# Patient Record
Sex: Female | Born: 2000 | Race: Black or African American | Hispanic: No | Marital: Single | Smoking: Current every day smoker
Health system: Southern US, Community
[De-identification: ages and names within clinical notes are randomized; demographics above are authoritative.]

## PROBLEM LIST (undated history)

## (undated) ENCOUNTER — Ambulatory Visit: Admission: EM | Payer: Medicaid Other | Source: Home / Self Care

## (undated) ENCOUNTER — Emergency Department: Admission: EM | Payer: MEDICAID | Source: Home / Self Care

## (undated) DIAGNOSIS — J45909 Unspecified asthma, uncomplicated: Secondary | ICD-10-CM

## (undated) DIAGNOSIS — A6000 Herpesviral infection of urogenital system, unspecified: Secondary | ICD-10-CM

## (undated) DIAGNOSIS — E669 Obesity, unspecified: Secondary | ICD-10-CM

---
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
## 2018-01-24 ENCOUNTER — Encounter (HOSPITAL_COMMUNITY): Payer: Self-pay | Admitting: Emergency Medicine

## 2018-01-24 ENCOUNTER — Other Ambulatory Visit: Payer: Self-pay

## 2018-01-24 ENCOUNTER — Emergency Department (HOSPITAL_COMMUNITY)
Admission: EM | Admit: 2018-01-24 | Discharge: 2018-01-24 | Disposition: A | Discharge status: Home / Self Care | Payer: Commercial Managed Care - PPO | Attending: Emergency Medicine | Admitting: Emergency Medicine

## 2018-01-24 DIAGNOSIS — R519 Headache, unspecified: Secondary | ICD-10-CM

## 2018-01-24 DIAGNOSIS — J45909 Unspecified asthma, uncomplicated: Secondary | ICD-10-CM | POA: Diagnosis not present

## 2018-01-24 DIAGNOSIS — R51 Headache: Secondary | ICD-10-CM | POA: Diagnosis present

## 2018-01-24 DIAGNOSIS — G43909 Migraine, unspecified, not intractable, without status migrainosus: Secondary | ICD-10-CM | POA: Diagnosis not present

## 2018-01-24 MED ORDER — SODIUM CHLORIDE 0.9 % IV BOLUS
1000.0000 mL | Freq: Once | INTRAVENOUS | Status: AC
  Administered 2018-01-24: 1000 mL via INTRAVENOUS

## 2018-01-24 MED ORDER — DIPHENHYDRAMINE HCL 50 MG/ML IJ SOLN
12.5000 mg | Freq: Once | INTRAMUSCULAR | Status: AC
  Administered 2018-01-24: 12.5 mg via INTRAVENOUS
  Filled 2018-01-24: qty 1

## 2018-01-24 MED ORDER — METOCLOPRAMIDE HCL 5 MG/ML IJ SOLN
10.0000 mg | Freq: Once | INTRAMUSCULAR | Status: AC
  Administered 2018-01-24: 10 mg via INTRAVENOUS
  Filled 2018-01-24: qty 2

## 2018-01-24 MED ORDER — KETOROLAC TROMETHAMINE 30 MG/ML IJ SOLN
30.0000 mg | Freq: Once | INTRAMUSCULAR | Status: AC
  Administered 2018-01-24: 30 mg via INTRAVENOUS
  Filled 2018-01-24: qty 1

## 2018-01-24 NOTE — ED Notes (Signed)
Says she obtained Hep A and B shots on Tuesday and headache started on Thursday with neck and back pain.  Denies injury.  Has taken OTC migraine meds with mild relief.  Did not take any meds today.  Pt is not sensitive to light, sound nor room temp.  Pt says pain is in her temporal area and goes to her neck and down her back.  Pain is constant and rates pain 10/10.

## 2018-01-24 NOTE — Discharge Instructions (Signed)
See your Physician for recheck in 1 week.   Try Excedrin migraine for future headaches.

## 2018-01-24 NOTE — ED Triage Notes (Signed)
Pt c/o of migraine with neck pain x 1 week. No medications taken at home.

## 2018-01-25 ENCOUNTER — Emergency Department (HOSPITAL_COMMUNITY)
Admission: EM | Admit: 2018-01-25 | Discharge: 2018-01-25 | Disposition: A | Discharge status: Home / Self Care | Payer: Commercial Managed Care - PPO | Attending: Emergency Medicine | Admitting: Emergency Medicine

## 2018-01-25 ENCOUNTER — Other Ambulatory Visit: Payer: Self-pay

## 2018-01-25 ENCOUNTER — Encounter (HOSPITAL_COMMUNITY): Payer: Self-pay | Admitting: *Deleted

## 2018-01-25 DIAGNOSIS — R519 Headache, unspecified: Secondary | ICD-10-CM

## 2018-01-25 DIAGNOSIS — J45909 Unspecified asthma, uncomplicated: Secondary | ICD-10-CM | POA: Diagnosis not present

## 2018-01-25 DIAGNOSIS — R110 Nausea: Secondary | ICD-10-CM | POA: Insufficient documentation

## 2018-01-25 DIAGNOSIS — R51 Headache: Secondary | ICD-10-CM | POA: Insufficient documentation

## 2018-01-25 MED ORDER — DIPHENHYDRAMINE HCL 50 MG/ML IJ SOLN
25.0000 mg | Freq: Once | INTRAMUSCULAR | Status: AC
Start: 2018-01-25 — End: 2018-01-25
  Administered 2018-01-25: 25 mg via INTRAVENOUS
  Filled 2018-01-25: qty 1

## 2018-01-25 MED ORDER — BUTALBITAL-APAP-CAFFEINE 50-325-40 MG PO TABS: 1.0000 | ORAL_TABLET | Freq: Four times a day (QID) | ORAL | 0 refills | Status: DC | PRN

## 2018-01-25 MED ORDER — DEXAMETHASONE 4 MG PO TABS: 4.0000 mg | ORAL_TABLET | Freq: Two times a day (BID) | ORAL | 0 refills | Status: DC

## 2018-01-25 MED ORDER — KETOROLAC TROMETHAMINE 30 MG/ML IJ SOLN
30.0000 mg | Freq: Once | INTRAMUSCULAR | Status: AC
Start: 2018-01-25 — End: 2018-01-25
  Administered 2018-01-25: 30 mg via INTRAVENOUS
  Filled 2018-01-25: qty 1

## 2018-01-25 MED ORDER — PROCHLORPERAZINE EDISYLATE 10 MG/2ML IJ SOLN
5.0000 mg | Freq: Once | INTRAMUSCULAR | Status: AC
  Administered 2018-01-25: 5 mg via INTRAVENOUS
  Filled 2018-01-25: qty 2

## 2018-01-25 NOTE — ED Provider Notes (Signed)
[facility] EMERGENCY DEPARTMENT Provider Note   CSN: [id number] Arrival date & time: 01/25/18  1648     History   Chief Complaint Chief Complaint  Patient presents with  . Headache    HPI Kristin Cisneros is a 17 y.o. female.  Patient is an 17 year old female who presents to the emergency department with a complaint of headaches.  The patient states that she was seen here on yesterday.  She had a very similar headache.  She was treated with IV medications.  The headache got better, but as the medications left her system, she says the headache came back.  Today she again has pain.  She describes this as a throbbing pain and sharp pain in both temples, the top of her head, and going down the back of the neck.  She has not had any injury to her head or fallen.  This is not the worst headache ever in her life.  She has not had vision changes, she has had some mild nausea, but no actual vomiting in the last 24 hours.  She presents now for assistance with this headache.  The history is provided by the patient.  Headache   Associated symptoms include nausea. Pertinent negatives include no palpitations and no shortness of breath.    Past Medical History:  Diagnosis Date  . Asthma   . Obesity     There are no active problems to display for this patient.   History reviewed. No pertinent surgical history.   OB History    Gravida      Para      Term      Preterm      AB      Living  1     SAB      TAB      Ectopic      Multiple      Live Births               Home Medications    Prior to Admission medications   Medication Sig Start Date End Date Taking? Authorizing Provider  benzonatate (TESSALON) 100 MG capsule Take 2 capsules (200 mg total) by mouth 3 (three) times daily as needed. 11/11/17   Burgess Amor, PA-C  cyclobenzaprine (FLEXERIL) 10 MG tablet Take 1 tablet (10 mg total) by mouth every 6 (six) hours as needed for muscle spasms. 08/11/17   Ivery Quale, PA-C  meloxicam (MOBIC) 15 MG tablet Take 1 tablet (15 mg total) by mouth daily. 08/03/17   Cuthriell, Delorise Royals, PA-C  methocarbamol (ROBAXIN) 500 MG tablet Take 1 tablet (500 mg total) by mouth 4 (four) times daily. 08/03/17   Cuthriell, Delorise Royals, PA-C    Family History Family History  Problem Relation Age of Onset  . Hypertension Mother     Social History Social History   Tobacco Use  . Smoking status: Never Smoker  . Smokeless tobacco: Never Used  Substance Use Topics  . Alcohol use: No  . Drug use: No     Allergies   Patient has no known allergies.   Review of Systems Review of Systems  Constitutional: Negative for activity change.       All ROS Neg except as noted in HPI  HENT: Negative for nosebleeds.   Eyes: Positive for photophobia. Negative for discharge.  Respiratory: Negative for cough, shortness of breath and wheezing.   Cardiovascular: Negative for chest pain and palpitations.  Gastrointestinal: Positive for nausea. Negative for abdominal  pain and blood in stool.  Genitourinary: Negative for dysuria, frequency and hematuria.  Musculoskeletal: Negative for arthralgias, back pain and neck pain.  Skin: Negative.   Neurological: Positive for headaches. Negative for dizziness, seizures and speech difficulty.  Psychiatric/Behavioral: Negative for confusion and hallucinations.     Physical Exam Updated Vital Signs BP (!) 98/54 (BP Location: Left Arm)   Pulse 93   Temp 99 F (37.2 C) (Oral)   Resp 17   Ht 5\' 2"  (1.575 m)   Wt 87.5 kg   LMP 01/16/2018   SpO2 98%   BMI 35.30 kg/m   Physical Exam  Constitutional: She appears well-developed and well-nourished. No distress.  HENT:  Head: Normocephalic and atraumatic.  Right Ear: External ear normal.  Left Ear: External ear normal.  Eyes: Conjunctivae are normal. Right eye exhibits no discharge. Left eye exhibits no discharge. No scleral icterus.  Neck: Neck supple. No tracheal deviation  present.  Cardiovascular: Normal rate, regular rhythm and intact distal pulses.  Pulmonary/Chest: Effort normal and breath sounds normal. No stridor. No respiratory distress. She has no wheezes. She has no rales.  Abdominal: Soft. Bowel sounds are normal. She exhibits no distension. There is no tenderness. There is no rebound and no guarding.  Musculoskeletal: She exhibits no edema or tenderness.  Neurological: She is alert. She has normal strength. No cranial nerve deficit (no facial droop, extraocular movements intact, no slurred speech) or sensory deficit. She exhibits normal muscle tone. She displays no seizure activity. Coordination normal.  Skin: Skin is warm and dry. No rash noted.  Psychiatric: She has a normal mood and affect.  Nursing note and vitals reviewed.    ED Treatments / Results  Labs (all labs ordered are listed, but only abnormal results are displayed) Labs Reviewed - No data to display  EKG None  Radiology No results found.  Procedures Procedures (including critical care time)  Medications Ordered in ED Medications  ketorolac (TORADOL) 30 MG/ML injection 30 mg (30 mg Intravenous Given 01/25/18 1839)  prochlorperazine (COMPAZINE) injection 5 mg (5 mg Intravenous Given 01/25/18 1839)  diphenhydrAMINE (BENADRYL) injection 25 mg (25 mg Intravenous Given 01/25/18 1838)     Initial Impression / Assessment and Plan / ED Course  I have reviewed the triage vital signs and the nursing notes.  Pertinent labs & imaging results that were available during my care of the patient were reviewed by me and considered in my medical decision making (see chart for details).       Final Clinical Impressions(s) / ED Diagnoses MDM  Vital signs reviewed.  No gross neurologic deficits appreciated on examination.  Patient's previous record reviewed.  Patient treated with IV Toradol, Compazine, and Benadryl.  Recheck.  Patient states headache is feeling much better.  No further  nausea.  Prescription for Decadron and Fioricet given to the patient.  Patient referred to Dr. Gaynell Face with the [facility] in [place].  Patient is in agreement with this plan.   Final diagnoses:  Bad headache    ED Discharge Orders         Ordered    dexamethasone (DECADRON) 4 MG tablet  2 times daily with meals     01/25/18 2004    butalbital-acetaminophen-caffeine (FIORICET, ESGIC) 50-325-40 MG tablet  Every 6 hours PRN     01/25/18 2004           Ivery Quale, PA-C 01/25/18 2029    Loren Racer, MD 01/28/18 1539

## 2018-01-25 NOTE — ED Triage Notes (Signed)
Patient seen yesterday for headache, treated, patient felt relief, headache returned last night with continued headache pain today.

## 2018-01-25 NOTE — ED Provider Notes (Signed)
[facility] EMERGENCY DEPARTMENT Provider Note   CSN: [id number] Arrival date & time: 01/24/18  1105     History   Chief Complaint Chief Complaint  Patient presents with  . Migraine    HPI Kristin Cisneros is a 17 y.o. female.  The history is provided by the patient. No language interpreter was used.  Migraine  This is a new problem. The current episode started more than 1 week ago. The problem occurs constantly. The problem has been gradually worsening. Nothing aggravates the symptoms. Nothing relieves the symptoms. She has tried nothing for the symptoms. The treatment provided no relief.  Pt complains of a bad headache.  Pt reports she has had for a week.  No relief with ibuprofen or tylenol   Past Medical History:  Diagnosis Date  . Asthma   . Obesity     There are no active problems to display for this patient.   History reviewed. No pertinent surgical history.   OB History    Gravida      Para      Term      Preterm      AB      Living  1     SAB      TAB      Ectopic      Multiple      Live Births               Home Medications    Prior to Admission medications   Medication Sig Start Date End Date Taking? Authorizing Provider  benzonatate (TESSALON) 100 MG capsule Take 2 capsules (200 mg total) by mouth 3 (three) times daily as needed. 11/11/17   Burgess Amor, PA-C  cyclobenzaprine (FLEXERIL) 10 MG tablet Take 1 tablet (10 mg total) by mouth every 6 (six) hours as needed for muscle spasms. 08/11/17   Ivery Quale, PA-C  meloxicam (MOBIC) 15 MG tablet Take 1 tablet (15 mg total) by mouth daily. 08/03/17   Cuthriell, Delorise Royals, PA-C  methocarbamol (ROBAXIN) 500 MG tablet Take 1 tablet (500 mg total) by mouth 4 (four) times daily. 08/03/17   Cuthriell, Delorise Royals, PA-C    Family History Family History  Problem Relation Age of Onset  . Hypertension Mother     Social History Social History   Tobacco Use  . Smoking status: Never Smoker  .  Smokeless tobacco: Never Used  Substance Use Topics  . Alcohol use: No  . Drug use: No     Allergies   Patient has no known allergies.   Review of Systems Review of Systems   Physical Exam Updated Vital Signs BP 114/65 (BP Location: Left Arm)   Pulse 72   Temp 98.3 F (36.8 C) (Tympanic)   Resp 16   Ht 5\' 2"  (1.575 m)   Wt 87.5 kg   SpO2 99%   BMI 35.30 kg/m   Physical Exam  Constitutional: She is oriented to person, place, and time. She appears well-developed and well-nourished.  HENT:  Head: Normocephalic.  Right Ear: External ear normal.  Left Ear: External ear normal.  Mouth/Throat: Oropharynx is clear and moist.  Eyes: Pupils are equal, round, and reactive to light. EOM are normal.  Neck: Normal range of motion.  Cardiovascular: Normal rate and regular rhythm.  Pulmonary/Chest: Effort normal.  Abdominal: She exhibits no distension.  Musculoskeletal: Normal range of motion.  Neurological: She is alert and oriented to person, place, and time.  Skin: Skin is warm.  Psychiatric: She has a normal mood and affect.  Nursing note and vitals reviewed.    ED Treatments / Results  Labs (all labs ordered are listed, but only abnormal results are displayed) Labs Reviewed - No data to display  EKG None  Radiology No results found.  Procedures Procedures (including critical care time)  Medications Ordered in ED Medications  sodium chloride 0.9 % bolus 1,000 mL (0 mLs Intravenous Stopped 01/24/18 1449)  ketorolac (TORADOL) 30 MG/ML injection 30 mg (30 mg Intravenous Given 01/24/18 1312)  diphenhydrAMINE (BENADRYL) injection 12.5 mg (12.5 mg Intravenous Given 01/24/18 1313)  metoCLOPramide (REGLAN) injection 10 mg (10 mg Intravenous Given 01/24/18 1311)     Initial Impression / Assessment and Plan / ED Course  I have reviewed the triage vital signs and the nursing notes.  Pertinent labs & imaging results that were available during my care of the patient were  reviewed by me and considered in my medical decision making (see chart for details).     Pt reports her headache feels much better. Pt advised to try excedrin for headaches   Final Clinical Impressions(s) / ED Diagnoses   Final diagnoses:  Bad headache    ED Discharge Orders    None    An After Visit Summary was printed and given to the patient.    Elson Areas, [place] 01/25/18 1631    Marily Memos, MD 01/26/18 2154

## 2018-01-25 NOTE — Discharge Instructions (Addendum)
Your vital signs are within normal limits.  Your neurologic examination is normal.  Your headache has responded nicely to the medication.  Please see Dr. Neale Burly, or a member of his staff for evaluation of your recurrent headaches.  Please use Decadron 2 times daily with food.  Use Fioricet every 6 hours as needed for headache.  This medication may cause drowsiness, please use it with caution.

## 2018-03-16 ENCOUNTER — Emergency Department (HOSPITAL_COMMUNITY): Payer: Commercial Managed Care - PPO

## 2018-03-16 ENCOUNTER — Encounter (HOSPITAL_COMMUNITY): Payer: Self-pay | Admitting: Emergency Medicine

## 2018-03-16 ENCOUNTER — Emergency Department (HOSPITAL_COMMUNITY)
Admission: EM | Admit: 2018-03-16 | Discharge: 2018-03-16 | Disposition: A | Discharge status: Home / Self Care | Payer: Commercial Managed Care - PPO | Attending: Emergency Medicine | Admitting: Emergency Medicine

## 2018-03-16 ENCOUNTER — Other Ambulatory Visit: Payer: Self-pay

## 2018-03-16 DIAGNOSIS — J45909 Unspecified asthma, uncomplicated: Secondary | ICD-10-CM | POA: Diagnosis not present

## 2018-03-16 DIAGNOSIS — S39012A Strain of muscle, fascia and tendon of lower back, initial encounter: Secondary | ICD-10-CM

## 2018-03-16 DIAGNOSIS — Y939 Activity, unspecified: Secondary | ICD-10-CM | POA: Diagnosis not present

## 2018-03-16 DIAGNOSIS — S20219A Contusion of unspecified front wall of thorax, initial encounter: Secondary | ICD-10-CM | POA: Diagnosis not present

## 2018-03-16 DIAGNOSIS — S161XXA Strain of muscle, fascia and tendon at neck level, initial encounter: Secondary | ICD-10-CM | POA: Insufficient documentation

## 2018-03-16 DIAGNOSIS — Y92410 Unspecified street and highway as the place of occurrence of the external cause: Secondary | ICD-10-CM | POA: Insufficient documentation

## 2018-03-16 DIAGNOSIS — Y998 Other external cause status: Secondary | ICD-10-CM | POA: Insufficient documentation

## 2018-03-16 DIAGNOSIS — Z79899 Other long term (current) drug therapy: Secondary | ICD-10-CM | POA: Insufficient documentation

## 2018-03-16 DIAGNOSIS — S199XXA Unspecified injury of neck, initial encounter: Secondary | ICD-10-CM | POA: Diagnosis present

## 2018-03-16 LAB — POC URINE PREG, ED: Preg Test, Ur: NEGATIVE

## 2018-03-16 MED ORDER — IBUPROFEN 600 MG PO TABS: 600.0000 mg | ORAL_TABLET | Freq: Four times a day (QID) | ORAL | 0 refills | Status: DC

## 2018-03-16 MED ORDER — CYCLOBENZAPRINE HCL 10 MG PO TABS: 10.0000 mg | ORAL_TABLET | Freq: Three times a day (TID) | ORAL | 0 refills | Status: DC

## 2018-03-16 MED ORDER — IBUPROFEN 800 MG PO TABS
800.0000 mg | ORAL_TABLET | Freq: Once | ORAL | Status: AC
  Administered 2018-03-16: 800 mg via ORAL
  Filled 2018-03-16: qty 1

## 2018-03-16 MED ORDER — ONDANSETRON HCL 4 MG PO TABS
4.0000 mg | ORAL_TABLET | Freq: Once | ORAL | Status: AC
  Administered 2018-03-16: 4 mg via ORAL
  Filled 2018-03-16: qty 1

## 2018-03-16 MED ORDER — TRAMADOL HCL 50 MG PO TABS
100.0000 mg | ORAL_TABLET | Freq: Once | ORAL | Status: AC
  Administered 2018-03-16: 100 mg via ORAL
  Filled 2018-03-16: qty 2

## 2018-03-16 MED ORDER — CYCLOBENZAPRINE HCL 10 MG PO TABS
10.0000 mg | ORAL_TABLET | Freq: Once | ORAL | Status: AC
  Administered 2018-03-16: 10 mg via ORAL
  Filled 2018-03-16: qty 1
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
## 2018-03-16 NOTE — ED Notes (Signed)
Duplicate order on POC preg, patient tested once.

## 2018-03-16 NOTE — Discharge Instructions (Signed)
Your vital signs within normal limits.  Your oxygen level is 99% on room air.  Your x-rays are negative for fracture or dislocation.  Your examination is consistent with muscle strain, and contusion of the chest wall.  Please use 600 mg of ibuprofen and 1000 mg of Tylenol with breakfast, lunch, dinner, and at bedtime.  Use Flexeril 3 times daily when possible for spasm pain. This medication may cause drowsiness. Please do not drink, drive, or participate in activity that requires concentration while taking this medication.  See your primary physician or return to the emergency department if any changes in your condition, problems, or concerns.
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
## 2018-11-25 ENCOUNTER — Emergency Department (HOSPITAL_COMMUNITY): Payer: Self-pay

## 2018-11-25 ENCOUNTER — Encounter (HOSPITAL_COMMUNITY): Payer: Self-pay | Admitting: Emergency Medicine

## 2018-11-25 ENCOUNTER — Other Ambulatory Visit: Payer: Self-pay

## 2018-11-25 ENCOUNTER — Emergency Department (HOSPITAL_COMMUNITY)
Admission: EM | Admit: 2018-11-25 | Discharge: 2018-11-25 | Disposition: A | Discharge status: Home / Self Care | Payer: Self-pay | Attending: Emergency Medicine | Admitting: Emergency Medicine

## 2018-11-25 DIAGNOSIS — R079 Chest pain, unspecified: Secondary | ICD-10-CM | POA: Insufficient documentation

## 2018-11-25 DIAGNOSIS — T148XXA Other injury of unspecified body region, initial encounter: Secondary | ICD-10-CM | POA: Insufficient documentation

## 2018-11-25 DIAGNOSIS — Y92410 Unspecified street and highway as the place of occurrence of the external cause: Secondary | ICD-10-CM | POA: Insufficient documentation

## 2018-11-25 DIAGNOSIS — R51 Headache: Secondary | ICD-10-CM | POA: Insufficient documentation

## 2018-11-25 DIAGNOSIS — Y9389 Activity, other specified: Secondary | ICD-10-CM | POA: Insufficient documentation

## 2018-11-25 DIAGNOSIS — Z79899 Other long term (current) drug therapy: Secondary | ICD-10-CM | POA: Insufficient documentation

## 2018-11-25 DIAGNOSIS — Y999 Unspecified external cause status: Secondary | ICD-10-CM | POA: Insufficient documentation

## 2018-11-25 LAB — CBC WITH DIFFERENTIAL/PLATELET
Abs Immature Granulocytes: 0.02 10*3/uL (ref 0.00–0.07)
Basophils Absolute: 0 10*3/uL (ref 0.0–0.1)
Basophils Relative: 1 %
Eosinophils Absolute: 0.1 10*3/uL (ref 0.0–0.5)
Eosinophils Relative: 2 %
HCT: 37.6 % (ref 36.0–46.0)
Hemoglobin: 12.6 g/dL (ref 12.0–15.0)
Immature Granulocytes: 0 %
Lymphocytes Relative: 36 %
Lymphs Abs: 2 10*3/uL (ref 0.7–4.0)
MCH: 27.3 pg (ref 26.0–34.0)
MCHC: 33.5 g/dL (ref 30.0–36.0)
MCV: 81.4 fL (ref 80.0–100.0)
Monocytes Absolute: 0.4 10*3/uL (ref 0.1–1.0)
Monocytes Relative: 7 %
Neutro Abs: 3 10*3/uL (ref 1.7–7.7)
Neutrophils Relative %: 54 %
Platelets: 160 10*3/uL (ref 150–400)
RBC: 4.62 MIL/uL (ref 3.87–5.11)
RDW: 14.1 % (ref 11.5–15.5)
WBC: 5.4 10*3/uL (ref 4.0–10.5)
nRBC: 0 % (ref 0.0–0.2)

## 2018-11-25 LAB — COMPREHENSIVE METABOLIC PANEL
ALT: 12 U/L (ref 0–44)
AST: 20 U/L (ref 15–41)
Albumin: 3.8 g/dL (ref 3.5–5.0)
Alkaline Phosphatase: 93 U/L (ref 38–126)
Anion gap: 10 (ref 5–15)
BUN: 16 mg/dL (ref 6–20)
CO2: 21 mmol/L — ABNORMAL LOW (ref 22–32)
Calcium: 9 mg/dL (ref 8.9–10.3)
Chloride: 105 mmol/L (ref 98–111)
Creatinine, Ser: 0.73 mg/dL (ref 0.44–1.00)
GFR calc Af Amer: >60 mL/min (ref >60)
GFR calc non Af Amer: >60 mL/min (ref >60)
Glucose, Bld: 97 mg/dL (ref 70–99)
Potassium: 3.7 mmol/L (ref 3.5–5.1)
Sodium: 136 mmol/L (ref 135–145)
Total Bilirubin: 0.7 mg/dL (ref 0.3–1.2)
Total Protein: 8.3 g/dL — ABNORMAL HIGH (ref 6.5–8.1)

## 2018-11-25 LAB — I-STAT BETA HCG BLOOD, ED (MC, WL, AP ONLY): I-stat hCG, quantitative: <5 m[IU]/mL (ref <5)

## 2018-11-25 MED ORDER — BACITRACIN ZINC 500 UNIT/GM EX OINT
TOPICAL_OINTMENT | CUTANEOUS | Status: AC
  Filled 2018-11-25: qty 0.9

## 2018-11-25 MED ORDER — IOHEXOL 300 MG/ML  SOLN
100.0000 mL | Freq: Once | INTRAMUSCULAR | Status: AC | PRN
  Administered 2018-11-25: 100 mL via INTRAVENOUS

## 2018-11-25 MED ORDER — HYDROMORPHONE HCL 1 MG/ML IJ SOLN
1.0000 mg | Freq: Once | INTRAMUSCULAR | Status: AC
  Administered 2018-11-25: 1 mg via INTRAVENOUS
  Filled 2018-11-25: qty 1

## 2018-11-25 MED ORDER — IBUPROFEN 800 MG PO TABS: 800.0000 mg | ORAL_TABLET | Freq: Three times a day (TID) | ORAL | 0 refills | Status: DC | PRN

## 2018-11-25 MED ORDER — DOUBLE ANTIBIOTIC 500-10000 UNIT/GM EX OINT
TOPICAL_OINTMENT | Freq: Once | CUTANEOUS | Status: AC
  Administered 2018-11-25: 1 via TOPICAL
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
## 2018-11-25 NOTE — Discharge Instructions (Addendum)
Follow up with your md if needed °

## 2019-09-30 ENCOUNTER — Encounter (HOSPITAL_COMMUNITY): Payer: Self-pay | Admitting: Emergency Medicine

## 2019-09-30 ENCOUNTER — Other Ambulatory Visit: Payer: Self-pay

## 2019-09-30 ENCOUNTER — Ambulatory Visit
Admission: EM | Admit: 2019-09-30 | Discharge: 2019-09-30 | Disposition: A | Discharge status: Home / Self Care | Payer: Self-pay | Attending: Emergency Medicine | Admitting: Emergency Medicine

## 2019-09-30 ENCOUNTER — Emergency Department (HOSPITAL_COMMUNITY)
Admission: EM | Admit: 2019-09-30 | Discharge: 2019-09-30 | Disposition: A | Discharge status: Home / Self Care | Payer: Self-pay | Attending: Emergency Medicine | Admitting: Emergency Medicine

## 2019-09-30 DIAGNOSIS — J029 Acute pharyngitis, unspecified: Secondary | ICD-10-CM | POA: Insufficient documentation

## 2019-09-30 DIAGNOSIS — Z20822 Contact with and (suspected) exposure to covid-19: Secondary | ICD-10-CM | POA: Insufficient documentation

## 2019-09-30 DIAGNOSIS — Z79899 Other long term (current) drug therapy: Secondary | ICD-10-CM | POA: Insufficient documentation

## 2019-09-30 DIAGNOSIS — F17210 Nicotine dependence, cigarettes, uncomplicated: Secondary | ICD-10-CM | POA: Insufficient documentation

## 2019-09-30 DIAGNOSIS — J45909 Unspecified asthma, uncomplicated: Secondary | ICD-10-CM | POA: Insufficient documentation

## 2019-09-30 LAB — POCT RAPID STREP A (OFFICE): Rapid Strep A Screen: NEGATIVE

## 2019-09-30 MED ORDER — PREDNISONE 20 MG PO TABS
40.0000 mg | ORAL_TABLET | Freq: Once | ORAL | Status: AC
  Administered 2019-09-30: 40 mg via ORAL
  Filled 2019-09-30: qty 2

## 2019-09-30 MED ORDER — HYDROCODONE-ACETAMINOPHEN 5-325 MG PO TABS: 1.0000 | ORAL_TABLET | Freq: Four times a day (QID) | ORAL | 0 refills | Status: DC | PRN

## 2019-09-30 MED ORDER — PREDNISONE 20 MG PO TABS: 40.0000 mg | ORAL_TABLET | Freq: Every day | ORAL | 0 refills | Status: DC

## 2019-09-30 MED ORDER — CETIRIZINE HCL 10 MG PO TABS: 10.0000 mg | ORAL_TABLET | Freq: Every day | ORAL | 0 refills | Status: DC

## 2019-09-30 NOTE — ED Provider Notes (Signed)
[facility] EMERGENCY DEPARTMENT Provider Note   CSN: [id number] Arrival date & time: 09/30/19  2104     History Chief Complaint  Patient presents with  . Sore Throat    Kristin Cisneros is a 19 y.o. female.  HPI Patient presents sore throat.  Has had for the last day or 2.  Seen this morning at urgent care.  Symptoms started with just some difficulty swallowing.  No choking episode that started but states it was a little difficult to get food down.  Then developed pain.  Pain in the throat with swallowing.  Had a negative strep test and since then has had negative Covid test.  No difficulty breathing.  Swelling on the right side of the neck.  Denies possibility of pregnancy.  No cough.  No sick contacts.    Past Medical History:  Diagnosis Date  . Asthma   . Obesity     There are no problems to display for this patient.   History reviewed. No pertinent surgical history.   OB History    Gravida      Para      Term      Preterm      AB      Living  1     SAB      TAB      Ectopic      Multiple      Live Births              Family History  Problem Relation Age of Onset  . Hypertension Mother     Social History   Tobacco Use  . Smoking status: Current Some Day Smoker  . Smokeless tobacco: Never Used  Substance Use Topics  . Alcohol use: No  . Drug use: No    Home Medications Prior to Admission medications   Medication Sig Start Date End Date Taking? Authorizing Provider  albuterol (VENTOLIN HFA) 108 (90 Base) MCG/ACT inhaler Inhale 1-2 puffs into the lungs every 6 (six) hours as needed for wheezing or shortness of breath.    [provider]  cetirizine (ZYRTEC) 10 MG tablet Take 1 tablet (10 mg total) by mouth daily. 09/30/19   Wurst, [place], PA-C  HYDROcodone-acetaminophen (NORCO/VICODIN) 5-325 MG tablet Take 1-2 tablets by mouth every 6 (six) hours as needed. 09/30/19   Benjiman Core, MD  ibuprofen (ADVIL) 800 MG tablet Take 1  tablet (800 mg total) by mouth every 8 (eight) hours as needed for moderate pain. 11/25/18   Bethann Berkshire, MD  predniSONE (DELTASONE) 20 MG tablet Take 2 tablets (40 mg total) by mouth daily. 10/01/19   Benjiman Core, MD    Allergies    Patient has no known allergies.  Review of Systems   Review of Systems  Constitutional: Negative for appetite change and fever.  HENT: Positive for sore throat. Negative for dental problem, drooling, facial swelling and sinus pressure.   Respiratory: Negative for shortness of breath.   Cardiovascular: Negative for leg swelling.  Skin: Negative for rash.  Neurological: Negative for weakness.    Physical Exam Updated Vital Signs BP 109/66 (BP Location: Right Arm)   Pulse 100   Temp 99.1 F (37.3 C) (Oral)   Resp 20   Ht 5\' 6"  (1.676 m)   Wt 108.9 kg   LMP 09/12/2019 (Approximate)   SpO2 100%   BMI 38.74 kg/m   Physical Exam Vitals reviewed.  HENT:     Head: Normocephalic.  Mouth/Throat:     Tonsils: No tonsillar exudate or tonsillar abscesses.     Comments: Mild posterior pharyngeal erythema.  No asymmetry.  There is anterior cervical lymphadenopathy on the right.  No fluctuance. Pulmonary:     Breath sounds: No wheezing, rhonchi or rales.  Skin:    Capillary Refill: Capillary refill takes less than 2 seconds.  Neurological:     Mental Status: She is alert.     ED Results / Procedures / Treatments   Labs (all labs ordered are listed, but only abnormal results are displayed) Labs Reviewed - No data to display  EKG None  Radiology No results found.  Procedures Procedures (including critical care time)  Medications Ordered in ED Medications  predniSONE (DELTASONE) tablet 40 mg (40 mg Oral Given 09/30/19 2223)    ED Course  I have reviewed the triage vital signs and the nursing notes.  Pertinent labs & imaging results that were available during my care of the patient were reviewed by me and considered in my medical  decision making (see chart for details).    MDM Rules/Calculators/A&P                      Patient with sore throat.  Right neck lymphadenopathy.  Negative strep this morning and claims negative Covid test since would not see the results of this.  Well-appearing.  No stridor.  Some pain with swallowing but does not appear obstructed.  Will treat with steroids and pain medicines.  Think okay for discharge home.  ENT follow-up as needed if symptoms do not improve.  Final Clinical Impression(s) / ED Diagnoses Final diagnoses:  Acute pharyngitis, unspecified etiology    Rx / DC Orders ED Discharge Orders         Ordered    predniSONE (DELTASONE) 20 MG tablet  Daily     09/30/19 2222    HYDROcodone-acetaminophen (NORCO/VICODIN) 5-325 MG tablet  Every 6 hours PRN     09/30/19 2222           Davonna Belling, MD 09/30/19 2318

## 2019-09-30 NOTE — Discharge Instructions (Signed)
Strep negative.  Culture sent.  We will follow up with you regarding abnormal results Reach out to health at work for COVID testing and quarantine guidelines.  The phone number is [phone]  Get plenty of rest and push fluids Use OTC zyrtec for nasal congestion, runny nose, and/or sore throat Use medications daily for symptom relief Use OTC medications like ibuprofen or tylenol as needed fever or pain Follow up with PCP as needed Call or go to the ED if you have any new or worsening symptoms such as fever, worsening cough, shortness of breath, chest tightness, chest pain, turning blue, changes in mental status, etc..Marland Kitchen

## 2019-09-30 NOTE — ED Triage Notes (Signed)
Seen at [facility] and rx's cetirizine   Has taken and still has ST  Works here as a Field seismologist   Here for eval

## 2019-09-30 NOTE — Discharge Instructions (Signed)
If the swelling does not improve follow-up with ear nose and throat doctors.  Try and keep yourself hydrated.

## 2019-09-30 NOTE — ED Provider Notes (Signed)
[facility] CARE CENTER   [id number] 09/30/19 Arrival Time: 5631   CC: COVID symptoms  SUBJECTIVE: History from: patient.  Kristin Cisneros is a 19 y.o. female who presents with sore throat x 1 day.  Denies sick exposure to COVID, flu or strep.  Patient is a screen for [place] ed.  Denies alleviating factors.  Symptoms are made worse with swallowing, but tolerating own secretions and liquids.  Reports previous symptoms in the past with strep.   Denies fever, chills, fatigue, sinus pain, rhinorrhea, cough, SOB, wheezing, chest pain, nausea, changes in bowel or bladder habits.    ROS: As per HPI.  All other pertinent ROS negative.     Past Medical History:  Diagnosis Date  . Asthma   . Obesity    No past surgical history on file. No Known Allergies No current facility-administered medications on file prior to encounter.   Current Outpatient Medications on File Prior to Encounter  Medication Sig Dispense Refill  . albuterol (VENTOLIN HFA) 108 (90 Base) MCG/ACT inhaler Inhale 1-2 puffs into the lungs every 6 (six) hours as needed for wheezing or shortness of breath.    Marland Kitchen ibuprofen (ADVIL) 800 MG tablet Take 1 tablet (800 mg total) by mouth every 8 (eight) hours as needed for moderate pain. 21 tablet 0   Social History   Socioeconomic History  . Marital status: Single    Spouse name: Not on file  . Number of children: Not on file  . Years of education: Not on file  . Highest education level: Not on file  Occupational History  . Not on file  Tobacco Use  . Smoking status: Never Smoker  . Smokeless tobacco: Never Used  Substance and Sexual Activity  . Alcohol use: No  . Drug use: No  . Sexual activity: Not on file  Other Topics Concern  . Not on file  Social History Narrative  . Not on file   Social Determinants of Health   Financial Resource Strain:   . Difficulty of Paying Living Expenses:   Food Insecurity:   . Worried About Programme researcher, broadcasting/film/video in the Last Year:   .  Barista in the Last Year:   Transportation Needs:   . Freight forwarder (Medical):   Marland Kitchen Lack of Transportation (Non-Medical):   Physical Activity:   . Days of Exercise per Week:   . Minutes of Exercise per Session:   Stress:   . Feeling of Stress :   Social Connections:   . Frequency of Communication with Friends and Family:   . Frequency of Social Gatherings with Friends and Family:   . Attends Religious Services:   . Active Member of Clubs or Organizations:   . Attends Banker Meetings:   Marland Kitchen Marital Status:   Intimate Partner Violence:   . Fear of Current or Ex-Partner:   . Emotionally Abused:   Marland Kitchen Physically Abused:   . Sexually Abused:    Family History  Problem Relation Age of Onset  . Hypertension Mother     OBJECTIVE:  Vitals:   09/30/19 0834  BP: 120/85  Pulse: 79  Resp: 16  Temp: 98.5 F (36.9 C)  TempSrc: Oral  SpO2: 97%     General appearance: alert; well-appearing, nontoxic; speaking in full sentences and tolerating own secretions HEENT: NCAT; Ears: EACs clear, TMs pearly gray; Eyes: PERRL.  EOM grossly intact.Nose: nares patent without rhinorrhea, Throat: oropharynx clear, tonsils non erythematous or enlarged, uvula  midline  Neck: supple without LAD Lungs: unlabored respirations, symmetrical air entry; cough: absent; no respiratory distress; CTAB Heart: regular rate and rhythm.  Skin: warm and dry Psychological: alert and cooperative; normal mood and affect   LABS:  Results for orders placed or performed during the hospital encounter of 09/30/19 (from the past 24 hour(s))  POCT rapid strep A     Status: None   Collection Time: 09/30/19  8:45 AM  Result Value Ref Range   Rapid Strep A Screen Negative Negative     ASSESSMENT & PLAN:  1. Sore throat   2. Suspected COVID-19 virus infection     Meds ordered this encounter  Medications  . cetirizine (ZYRTEC) 10 MG tablet    Sig: Take 1 tablet (10 mg total) by mouth daily.     Dispense:  30 tablet    Refill:  0    Order Specific Question:   Supervising Provider    Answer:   Raylene Everts [[id number]]    Strep negative.  Culture sent.  We will follow up with you regarding abnormal results Reach out to health at work for [place] testing and quarantine guidelines.  The phone number is [phone]  Get plenty of rest and push fluids Use OTC zyrtec for nasal congestion, runny nose, and/or sore throat Use medications daily for symptom relief Use OTC medications like ibuprofen or tylenol as needed fever or pain Follow up with PCP as needed Call or go to the ED if you have any new or worsening symptoms such as fever, worsening cough, shortness of breath, chest tightness, chest pain, turning blue, changes in mental status, etc...   Reviewed expectations re: course of current medical issues. Questions answered. Outlined signs and symptoms indicating need for more acute intervention. Patient verbalized understanding. After Visit Summary given.         Stacey Drain [place], PA-C 09/30/19 [phone]

## 2019-10-02 LAB — CULTURE, GROUP A STREP (THRC)

## 2019-10-17 ENCOUNTER — Encounter: Payer: Self-pay | Admitting: Emergency Medicine

## 2019-10-17 ENCOUNTER — Other Ambulatory Visit: Payer: Self-pay

## 2019-10-17 ENCOUNTER — Ambulatory Visit
Admission: EM | Admit: 2019-10-17 | Discharge: 2019-10-17 | Disposition: A | Discharge status: Home / Self Care | Payer: Self-pay | Attending: Emergency Medicine | Admitting: Emergency Medicine

## 2019-10-17 DIAGNOSIS — H00014 Hordeolum externum left upper eyelid: Secondary | ICD-10-CM

## 2019-10-17 MED ORDER — OFLOXACIN 0.3 % OP SOLN: 1.0000 [drp] | Freq: Four times a day (QID) | OPHTHALMIC | 0 refills | Status: DC

## 2019-10-17 NOTE — ED Triage Notes (Signed)
Since the beginning of last week small amount of redness in inner corner of LT eye, reports a hardened area to the LT lid. Initally pt had  increased watering to eye and her eye lids stuck together when she woke up, but has not had a problem with this for last few days.

## 2019-10-17 NOTE — ED Provider Notes (Signed)
[place]   [id number] 10/17/19 Arrival Time: 58  CC: Red eye  SUBJECTIVE:  Kristin Cisneros is a 19 y.o. female who presents with complaint of left eye irritation and redness and upper lid swelling for the past week.  Denies a precipitating event, trauma, or close contacts with similar symptoms.  Has tried OTC eye drops without relief.  Nothing make her symptoms worse.  Denies similar symptoms in the past.  Denies fever, chills, nausea, vomiting, eye pain, painful eye movements, halos, discharge, itching, vision changes, double vision, FB sensation, periorbital erythema.     Denies contact lens use.    ROS: As per HPI.  All other pertinent ROS negative.     Past Medical History:  Diagnosis Date  . Asthma   . Obesity    History reviewed. No pertinent surgical history. No Known Allergies No current facility-administered medications on file prior to encounter.   Current Outpatient Medications on File Prior to Encounter  Medication Sig Dispense Refill  . albuterol (VENTOLIN HFA) 108 (90 Base) MCG/ACT inhaler Inhale 1-2 puffs into the lungs every 6 (six) hours as needed for wheezing or shortness of breath.    . cetirizine (ZYRTEC) 10 MG tablet Take 1 tablet (10 mg total) by mouth daily. 30 tablet 0  . HYDROcodone-acetaminophen (NORCO/VICODIN) 5-325 MG tablet Take 1-2 tablets by mouth every 6 (six) hours as needed. 6 tablet 0  . ibuprofen (ADVIL) 800 MG tablet Take 1 tablet (800 mg total) by mouth every 8 (eight) hours as needed for moderate pain. 21 tablet 0  . predniSONE (DELTASONE) 20 MG tablet Take 2 tablets (40 mg total) by mouth daily. 4 tablet 0   Social History   Socioeconomic History  . Marital status: Single    Spouse name: Not on file  . Number of children: Not on file  . Years of education: Not on file  . Highest education level: Not on file  Occupational History  . Not on file  Tobacco Use  . Smoking status: Current Some Day Smoker  . Smokeless tobacco:  Never Used  Substance and Sexual Activity  . Alcohol use: No  . Drug use: No  . Sexual activity: Not on file  Other Topics Concern  . Not on file  Social History Narrative  . Not on file   Social Determinants of Health   Financial Resource Strain:   . Difficulty of Paying Living Expenses:   Food Insecurity:   . Worried About Charity fundraiser in the Last Year:   . Arboriculturist in the Last Year:   Transportation Needs:   . Film/video editor (Medical):   Marland Kitchen Lack of Transportation (Non-Medical):   Physical Activity:   . Days of Exercise per Week:   . Minutes of Exercise per Session:   Stress:   . Feeling of Stress :   Social Connections:   . Frequency of Communication with Friends and Family:   . Frequency of Social Gatherings with Friends and Family:   . Attends Religious Services:   . Active Member of Clubs or Organizations:   . Attends Archivist Meetings:   Marland Kitchen Marital Status:   Intimate Partner Violence:   . Fear of Current or Ex-Partner:   . Emotionally Abused:   Marland Kitchen Physically Abused:   . Sexually Abused:    Family History  Problem Relation Age of Onset  . Hypertension Mother     OBJECTIVE:    Visual Acuity  Right Eye Distance:   Left Eye Distance:   Bilateral Distance:    Right Eye Near:   Left Eye Near:    Bilateral Near:      Vitals:   10/17/19 1641 10/17/19 1643  BP:  124/72  Pulse:  93  Resp:  19  Temp:  99 F (37.2 C)  TempSrc:  Oral  SpO2:  95%  Weight: 185 lb (83.9 kg)   Height: 5\' 2"  (1.575 m)     Physical Exam Vitals and nursing note reviewed.  Constitutional:      General: She is not in acute distress.    Appearance: Normal appearance. She is normal weight. She is not ill-appearing, toxic-appearing or diaphoretic.  Eyes:     General: Lids are normal. Lids are everted, no foreign bodies appreciated. Vision grossly intact. Gaze aligned appropriately.        Right eye: No foreign body, discharge or hordeolum.         Left eye: Hordeolum present.No foreign body or discharge.     Conjunctiva/sclera:     Right eye: Right conjunctiva is not injected. No chemosis or exudate.    Left eye: Left conjunctiva is not injected. No chemosis, exudate or hemorrhage. Cardiovascular:     Rate and Rhythm: Normal rate and regular rhythm.     Pulses: Normal pulses.     Heart sounds: Normal heart sounds. No murmur. No friction rub. No gallop.   Pulmonary:     Effort: Pulmonary effort is normal. No respiratory distress.     Breath sounds: Normal breath sounds. No stridor. No wheezing, rhonchi or rales.  Chest:     Chest wall: No tenderness.  Neurological:     Mental Status: She is alert.      ASSESSMENT & PLAN:  1. Hordeolum externum of left upper eyelid     Meds ordered this encounter  Medications  . ofloxacin (OCUFLOX) 0.3 % ophthalmic solution    Sig: Place 1 drop into the left eye 4 (four) times daily.    Dispense:  5 mL    Refill:  0      Discharge instructions Continue warm compresses at home.  Soak a wash cloth in warm (not scalding) water and place it over the eyes. As the wash cloth cools, it should be rewarmed and replaced for a total of 5 to 10 minutes of soaking time. Warm compresses should be applied two to four times a day as long as the patient has symptoms Perform lid washing: Either warm water or very dilute baby shampoo can be placed on a clean wash cloth, gauze pad, or cotton swab. Then be advised to gently clean along the lashes and lid margin to remove the accumulated material with care to avoid contacting the ocular surface. If shampoo is used, thorough rinsing is recommended. Vigorous washing should be avoided, as it may cause more irritation.  Prescribed erythromycin ointment.  Apply up to 6 times daily for 5-7 days, or until symptomatic improvement Follow up with ophthalmology for further evaluation and management if symptoms persists Return or go to ER if you have any new or worsening  symptoms such as fever, chills, redness, swelling, eye pain, painful eye movements, vision changes, etc...  Reviewed expectations re: course of current medical issues. Questions answered. Outlined signs and symptoms indicating need for more acute intervention. Patient verbalized understanding. After Visit Summary given.   , FNP 10/17/19 1722

## 2019-10-17 NOTE — Discharge Instructions (Addendum)
Continue warm compresses at home.  Soak a wash cloth in warm (not scalding) water and place it over the eyes. As the wash cloth cools, it should be rewarmed and replaced for a total of 5 to 10 minutes of soaking time. Warm compresses should be applied two to four times a day as long as the patient has symptoms Perform lid washing: Either warm water or very dilute baby shampoo can be placed on a clean wash cloth, gauze pad, or cotton swab. Then be advised to gently clean along the lashes and lid margin to remove the accumulated material with care to avoid contacting the ocular surface. If shampoo is used, thorough rinsing is recommended. Vigorous washing should be avoided, as it may cause more irritation.  Prescribed ofloxacin eyedrops Follow up with ophthalmology for further evaluation and management if symptoms persists Return or go to ER if you have any new or worsening symptoms such as fever, chills, redness, swelling, eye pain, painful eye movements, vision changes, etc..Marland Kitchen

## 2019-11-08 ENCOUNTER — Encounter (HOSPITAL_COMMUNITY): Payer: Self-pay | Admitting: Emergency Medicine

## 2019-11-08 ENCOUNTER — Emergency Department (HOSPITAL_COMMUNITY)
Admission: EM | Admit: 2019-11-08 | Discharge: 2019-11-08 | Disposition: A | Discharge status: Home / Self Care | Payer: Medicaid Other | Attending: Emergency Medicine | Admitting: Emergency Medicine

## 2019-11-08 ENCOUNTER — Other Ambulatory Visit: Payer: Self-pay

## 2019-11-08 DIAGNOSIS — H1045 Other chronic allergic conjunctivitis: Secondary | ICD-10-CM | POA: Insufficient documentation

## 2019-11-08 DIAGNOSIS — H1013 Acute atopic conjunctivitis, bilateral: Secondary | ICD-10-CM

## 2019-11-08 DIAGNOSIS — F17200 Nicotine dependence, unspecified, uncomplicated: Secondary | ICD-10-CM | POA: Insufficient documentation

## 2019-11-08 DIAGNOSIS — H5789 Other specified disorders of eye and adnexa: Secondary | ICD-10-CM | POA: Diagnosis present

## 2019-11-08 DIAGNOSIS — J45909 Unspecified asthma, uncomplicated: Secondary | ICD-10-CM | POA: Diagnosis not present

## 2019-11-08 MED ORDER — AZELASTINE HCL 0.05 % OP SOLN: 1.0000 [drp] | Freq: Two times a day (BID) | OPHTHALMIC | 0 refills | Status: DC

## 2019-11-08 MED ORDER — CARBOXYMETHYLCELLULOSE SODIUM 0.5 % OP SOLN: 1.0000 [drp] | Freq: Three times a day (TID) | OPHTHALMIC | 0 refills | Status: DC | PRN

## 2019-11-08 MED ORDER — FLUORESCEIN SODIUM 1 MG OP STRP
1.0000 | ORAL_STRIP | Freq: Once | OPHTHALMIC | Status: DC
  Filled 2019-11-08: qty 1

## 2019-11-08 MED ORDER — TETRACAINE HCL 0.5 % OP SOLN
2.0000 [drp] | Freq: Once | OPHTHALMIC | Status: DC
  Filled 2019-11-08: qty 4

## 2019-11-08 NOTE — ED Triage Notes (Signed)
Pt has swelling and redness to LEFT eye.

## 2019-11-08 NOTE — ED Provider Notes (Signed)
[facility] EMERGENCY DEPARTMENT Provider Note   CSN: [id number] Arrival date & time: 11/08/19  1850     History Chief Complaint  Patient presents with  . Eye Problem    Alayah Knouff is a 19 y.o. female.  HPI 19 year old female presents with left eye redness and itching.  Started this afternoon.  Started after she was given a "hand me down the shoe".  She tried it on and then rubbed her left eye and immediately started having redness and itching.  Has had clear tearing.  Left eye is a little blurry.  She does not wear contacts or glasses.  There is no ocular pain and the itching is better but she still has the redness and some periorbital swelling.   Past Medical History:  Diagnosis Date  . Asthma   . Obesity     There are no problems to display for this patient.   History reviewed. No pertinent surgical history.   OB History    Gravida      Para      Term      Preterm      AB      Living  1     SAB      TAB      Ectopic      Multiple      Live Births              Family History  Problem Relation Age of Onset  . Hypertension Mother     Social History   Tobacco Use  . Smoking status: Current Some Day Smoker  . Smokeless tobacco: Never Used  Vaping Use  . Vaping Use: Never used  Substance Use Topics  . Alcohol use: No  . Drug use: No    Home Medications Prior to Admission medications   Medication Sig Start Date End Date Taking? Authorizing Provider  albuterol (VENTOLIN HFA) 108 (90 Base) MCG/ACT inhaler Inhale 1-2 puffs into the lungs every 6 (six) hours as needed for wheezing or shortness of breath.    [provider]  azelastine (OPTIVAR) 0.05 % ophthalmic solution Place 1 drop into both eyes 2 (two) times daily. 11/08/19   Sherwood Gambler, MD  carboxymethylcellulose ([employer] LUBRICANT EYE DROPS) 0.5 % SOLN Place 1 drop into both eyes 3 (three) times daily as needed. 11/08/19   Sherwood Gambler, MD  cetirizine (ZYRTEC) 10 MG tablet  Take 1 tablet (10 mg total) by mouth daily. 09/30/19   Wurst, [place], PA-C  HYDROcodone-acetaminophen (NORCO/VICODIN) 5-325 MG tablet Take 1-2 tablets by mouth every 6 (six) hours as needed. 09/30/19   Davonna Belling, MD  ibuprofen (ADVIL) 800 MG tablet Take 1 tablet (800 mg total) by mouth every 8 (eight) hours as needed for moderate pain. 11/25/18   Milton Ferguson, MD  ofloxacin (OCUFLOX) 0.3 % ophthalmic solution Place 1 drop into the left eye 4 (four) times daily. 10/17/19   Avegno, Darrelyn Hillock, FNP  predniSONE (DELTASONE) 20 MG tablet Take 2 tablets (40 mg total) by mouth daily. 10/01/19   Davonna Belling, MD    Allergies    Patient has no known allergies.  Review of Systems   Review of Systems  Eyes: Positive for discharge (clear), redness and itching. Negative for pain.    Physical Exam Updated Vital Signs BP (!) 141/93 (BP Location: Right Arm)   Pulse 75   Temp 98.5 F (36.9 C) (Oral)   Resp 16   Ht 5\' 2"  (1.575 m)  Wt 104.3 kg   LMP 10/17/2019   SpO2 100%   BMI 42.07 kg/m   Physical Exam Vitals and nursing note reviewed.  Constitutional:      Appearance: She is well-developed. She is obese.  HENT:     Head: Normocephalic and atraumatic.     Right Ear: External ear normal.     Left Ear: External ear normal.     Nose: Nose normal.  Eyes:     General:        Right eye: No discharge.        Left eye: No discharge.     Extraocular Movements: Extraocular movements intact.     Conjunctiva/sclera:     Left eye: Left conjunctiva is injected. Chemosis (medial) present.     Pupils: Pupils are equal, round, and reactive to light.  Pulmonary:     Effort: Pulmonary effort is normal.  Skin:    General: Skin is warm and dry.  Neurological:     Mental Status: She is alert.  Psychiatric:        Mood and Affect: Mood is not anxious.     ED Results / Procedures / Treatments   Labs (all labs ordered are listed, but only abnormal results are displayed) Labs Reviewed -  No data to display  EKG None  Radiology No results found.  Procedures Procedures (including critical care time)  Medications Ordered in ED Medications  tetracaine (PONTOCAINE) 0.5 % ophthalmic solution 2 drop (2 drops Right Eye Not Given 11/08/19 2130)  fluorescein ophthalmic strip 1 strip (1 strip Left Eye Not Given 11/08/19 2131)    ED Course  I have reviewed the triage vital signs and the nursing notes.  Pertinent labs & imaging results that were available during my care of the patient were reviewed by me and considered in my medical decision making (see chart for details).    MDM Rules/Calculators/A&P                          Patient appears to have what is likely allergic conjunctivitis.  I am not sure this issue specifically as the cause but prior to me staining her eye she developed right sided symptoms as well.  No purulent drainage.  No URI symptoms.  Could be viral conjunctivitis but bacterial conjunctivitis seems highly unlikely.  Now it is much more likely this is a foreign body or abrasion given bilateral symptoms.  Offered to still stain but it is probably unnecessary now.  She declines.  We will treat supportively and follow-up with ophthalmology if not improving. Final Clinical Impression(s) / ED Diagnoses Final diagnoses:  Allergic conjunctivitis of both eyes    Rx / DC Orders ED Discharge Orders         Ordered    azelastine (OPTIVAR) 0.05 % ophthalmic solution  2 times daily     Discontinue  Reprint     11/08/19 2123    carboxymethylcellulose ([employer]S LUBRICANT EYE DROPS) 0.5 % SOLN  3 times daily PRN     Discontinue  Reprint     11/08/19 2123           Pricilla Loveless, MD 11/08/19 [phone]

## 2019-11-08 NOTE — Discharge Instructions (Signed)
If you develop fever, purulent drainage, eye pain, vision changes, or any other new/concerning symptoms, return to the ER or see the eye specialist listed.

## 2019-11-08 NOTE — ED Notes (Signed)
Right eye now red and itching

## 2020-01-24 ENCOUNTER — Ambulatory Visit
Admission: EM | Admit: 2020-01-24 | Discharge: 2020-01-24 | Disposition: A | Discharge status: Home / Self Care | Payer: Medicaid Other | Attending: Emergency Medicine | Admitting: Emergency Medicine

## 2020-01-24 ENCOUNTER — Encounter: Payer: Self-pay | Admitting: Emergency Medicine

## 2020-01-24 DIAGNOSIS — L02416 Cutaneous abscess of left lower limb: Secondary | ICD-10-CM | POA: Diagnosis not present

## 2020-01-24 MED ORDER — IBUPROFEN 800 MG PO TABS
800.0000 mg | ORAL_TABLET | Freq: Three times a day (TID) | ORAL | 0 refills | Status: DC
Start: 2020-01-24 — End: 2022-06-10

## 2020-01-24 MED ORDER — DOXYCYCLINE HYCLATE 100 MG PO CAPS
100.0000 mg | ORAL_CAPSULE | Freq: Two times a day (BID) | ORAL | 0 refills | Status: AC
Start: 2020-01-24 — End: 2020-01-31

## 2020-01-24 NOTE — ED Triage Notes (Signed)
abscess to LT upper leg near knee x 3 days

## 2020-01-24 NOTE — Discharge Instructions (Signed)
Please begin doxycycline for 10 days ° °Apply warm compresses/hot rags to area with massage to express further drainage especially the first 24-48 hours ° °Return if symptoms returning or not improving  °

## 2020-01-25 NOTE — ED Provider Notes (Signed)
[facility]    CSN: [id number] Arrival date & time: 01/24/20  1735      History   Chief Complaint Chief Complaint  Patient presents with   Abscess    HPI Kristin Cisneros is a 19 y.o. female presenting today for evaluation of an abscess.  Patient reports that over the past 3 days she has had increased pain swelling and redness to the inner aspect of her left leg.  Denies any fevers.  Has had pain with walking.  Has attempted to drain at home without success.  HPI  Past Medical History:  Diagnosis Date   Asthma    Obesity     There are no problems to display for this patient.   History reviewed. No pertinent surgical history.  OB History    Gravida      Para      Term      Preterm      AB      Living  1     SAB      TAB      Ectopic      Multiple      Live Births               Home Medications    Prior to Admission medications   Medication Sig Start Date End Date Taking? Authorizing Provider  albuterol (VENTOLIN HFA) 108 (90 Base) MCG/ACT inhaler Inhale 1-2 puffs into the lungs every 6 (six) hours as needed for wheezing or shortness of breath.    [provider]  doxycycline (VIBRAMYCIN) 100 MG capsule Take 1 capsule (100 mg total) by mouth 2 (two) times daily for 7 days. 01/24/20 01/31/20  Selah Klang C, PA-C  ibuprofen (ADVIL) 800 MG tablet Take 1 tablet (800 mg total) by mouth 3 (three) times daily. 01/24/20   Lafonda Patron C, PA-C  cetirizine (ZYRTEC) 10 MG tablet Take 1 tablet (10 mg total) by mouth daily. 09/30/19 01/24/20  Rennis Harding, PA-C    Family History Family History  Problem Relation Age of Onset   Hypertension Mother     Social History Social History   Tobacco Use   Smoking status: Current Some Day Smoker   Smokeless tobacco: Never Used  Building services engineer Use: Never used  Substance Use Topics   Alcohol use: No   Drug use: No     Allergies   Patient has no known allergies.   Review  of Systems Review of Systems  Constitutional: Negative for fatigue and fever.  Eyes: Negative for visual disturbance.  Respiratory: Negative for shortness of breath.   Cardiovascular: Negative for chest pain.  Gastrointestinal: Negative for abdominal pain, nausea and vomiting.  Musculoskeletal: Negative for arthralgias and joint swelling.  Skin: Positive for color change and rash. Negative for wound.  Neurological: Negative for dizziness, weakness, light-headedness and headaches.     Physical Exam Triage Vital Signs ED Triage Vitals  Enc Vitals Group     BP 01/24/20 1849 108/73     Pulse Rate 01/24/20 1849 94     Resp 01/24/20 1849 17     Temp 01/24/20 1849 98.6 F (37 C)     Temp Source 01/24/20 1849 Tympanic     SpO2 01/24/20 1849 98 %     Weight --      Height --      Head Circumference --      Peak Flow --      Pain Score  01/24/20 1914 5     Pain Loc --      Pain Edu? --      Excl. in GC? --    No data found.  Updated Vital Signs BP 108/73 (BP Location: Right Arm)    Pulse 94    Temp 98.6 F (37 C) (Tympanic)    Resp 17    LMP 01/24/2020    SpO2 98%   Visual Acuity Right Eye Distance:   Left Eye Distance:   Bilateral Distance:    Right Eye Near:   Left Eye Near:    Bilateral Near:     Physical Exam Vitals and nursing note reviewed.  Constitutional:      Appearance: She is well-developed.     Comments: No acute distress  HENT:     Head: Normocephalic and atraumatic.     Nose: Nose normal.  Eyes:     Conjunctiva/sclera: Conjunctivae normal.  Cardiovascular:     Rate and Rhythm: Normal rate.  Pulmonary:     Effort: Pulmonary effort is normal. No respiratory distress.  Abdominal:     General: There is no distension.  Musculoskeletal:        General: Normal range of motion.     Cervical back: Neck supple.     Comments: Full active range of motion of left knee  Skin:    General: Skin is warm and dry.     Comments: Left medial aspect of knee with  circular area with central superficial small 0.5 cm area of pus with surrounding erythema and induration, faint erythema so spreading outward more circumferentially  Neurological:     Mental Status: She is alert and oriented to person, place, and time.      UC Treatments / Results  Labs (all labs ordered are listed, but only abnormal results are displayed) Labs Reviewed - No data to display  EKG   Radiology No results found.  Procedures Procedures (including critical care time)  Medications Ordered in UC Medications - No data to display  Initial Impression / Assessment and Plan / UC Course  I have reviewed the triage vital signs and the nursing notes.  Pertinent labs & imaging results that were available during my care of the patient were reviewed by me and considered in my medical decision making (see chart for details).     Superficial area of pus drained with Betadine and 18-gauge needle, treating for cellulitis/abscess with doxycycline and recommending continuing warm compresses and gentle massage.  Discussed strict return precautions. Patient verbalized understanding and is agreeable with plan.  Final Clinical Impressions(s) / UC Diagnoses   Final diagnoses:  Abscess of leg, left     Discharge Instructions     Please begin doxycycline for 10 days  Apply warm compresses/hot rags to area with massage to express further drainage especially the first 24-48 hours  Return if symptoms returning or not improving   ED Prescriptions    Medication Sig Dispense Auth. Provider   doxycycline (VIBRAMYCIN) 100 MG capsule Take 1 capsule (100 mg total) by mouth 2 (two) times daily for 7 days. 14 capsule Kace Hartje C, PA-C   ibuprofen (ADVIL) 800 MG tablet Take 1 tablet (800 mg total) by mouth 3 (three) times daily. 21 tablet Montford Barg, [place] C, PA-C     PDMP not reviewed this encounter.   Lew Dawes, [place] 01/25/20 [phone]

## 2020-02-29 ENCOUNTER — Encounter: Payer: Self-pay | Admitting: *Deleted

## 2020-02-29 ENCOUNTER — Other Ambulatory Visit: Payer: Self-pay

## 2020-02-29 ENCOUNTER — Ambulatory Visit
Admission: EM | Admit: 2020-02-29 | Discharge: 2020-02-29 | Disposition: A | Discharge status: Home / Self Care | Payer: Medicaid Other | Attending: Emergency Medicine | Admitting: Emergency Medicine

## 2020-02-29 DIAGNOSIS — R059 Cough, unspecified: Secondary | ICD-10-CM | POA: Diagnosis not present

## 2020-02-29 DIAGNOSIS — B372 Candidiasis of skin and nail: Secondary | ICD-10-CM | POA: Diagnosis not present

## 2020-02-29 MED ORDER — NYSTATIN 100000 UNIT/GM EX CREA
TOPICAL_CREAM | CUTANEOUS | 0 refills | Status: DC
Start: 2020-02-29 — End: 2020-03-13

## 2020-02-29 NOTE — Discharge Instructions (Signed)
Use the prescribed nystatin cream on the rash under your breast as directed.    Take over-the-counter allergy medication such as Zyrtec or Allegra for your cough and allergy symptoms.    Follow up with your primary care provider if your symptoms are not improving.

## 2020-02-29 NOTE — ED Notes (Signed)
Pt left due to her not wanting her husband to wait outside or in the car.  Will come back later

## 2020-02-29 NOTE — ED Triage Notes (Signed)
Rash under bilateral breasts after wearing a new sports bra on Tuesday. Reports severe discomfort.

## 2020-02-29 NOTE — ED Provider Notes (Signed)
Kristin Cisneros    CSN: [id number] Arrival date & time: 02/29/20  1310      History   Chief Complaint Chief Complaint  Patient presents with  . Rash    HPI Kristin Cisneros is a 19 y.o. female.   Patient presents with 2-day history of rash under her breasts which she reports is painful.  She has taken ibuprofen for the discomfort.  She states the symptoms started after wearing a new sports bra.  Patient also reports a nonproductive cough due to allergies.  She denies fever, chills, sore throat, shortness of breath, vomiting, diarrhea, or other symptoms.  Her medical history includes asthma and obesity.  The history is provided by the patient.    Past Medical History:  Diagnosis Date  . Asthma   . Obesity     There are no problems to display for this patient.   History reviewed. No pertinent surgical history.  OB History    Gravida      Para      Term      Preterm      AB      Living  1     SAB      TAB      Ectopic      Multiple      Live Births               Home Medications    Prior to Admission medications   Medication Sig Start Date End Date Taking? Authorizing Provider  ibuprofen (ADVIL) 800 MG tablet Take 1 tablet (800 mg total) by mouth 3 (three) times daily. 01/24/20  Yes Wieters, Hallie C, PA-C  albuterol (VENTOLIN HFA) 108 (90 Base) MCG/ACT inhaler Inhale 1-2 puffs into the lungs every 6 (six) hours as needed for wheezing or shortness of breath.    [provider]  nystatin cream (MYCOSTATIN) Apply to affected area 2 times daily 02/29/20   Mickie Bail, NP  cetirizine (ZYRTEC) 10 MG tablet Take 1 tablet (10 mg total) by mouth daily. 09/30/19 01/24/20  Rennis Harding, PA-C    Family History Family History  Problem Relation Age of Onset  . Hypertension Mother     Social History Social History   Tobacco Use  . Smoking status: Current Some Day Smoker  . Smokeless tobacco: Never Used  Vaping Use  . Vaping Use: Never  used  Substance Use Topics  . Alcohol use: No  . Drug use: No     Allergies   Patient has no known allergies.   Review of Systems Review of Systems  Constitutional: Negative for chills and fever.  HENT: Negative for ear pain and sore throat.   Eyes: Negative for pain and visual disturbance.  Respiratory: Positive for cough. Negative for shortness of breath.   Cardiovascular: Negative for chest pain and palpitations.  Gastrointestinal: Negative for abdominal pain and vomiting.  Genitourinary: Negative for dysuria and hematuria.  Musculoskeletal: Negative for arthralgias and back pain.  Skin: Positive for rash. Negative for color change.  Neurological: Negative for seizures and syncope.  All other systems reviewed and are negative.    Physical Exam Triage Vital Signs ED Triage Vitals  Enc Vitals Group     BP      Pulse      Resp      Temp      Temp src      SpO2      Weight  Height      Head Circumference      Peak Flow      Pain Score      Pain Loc      Pain Edu?      Excl. in GC?    No data found.  Updated Vital Signs BP 113/76 (BP Location: Left Arm)   Pulse 84   Temp 98 F (36.7 C)   Resp 16   SpO2 100%   Visual Acuity Right Eye Distance:   Left Eye Distance:   Bilateral Distance:    Right Eye Near:   Left Eye Near:    Bilateral Near:     Physical Exam Vitals and nursing note reviewed.  Constitutional:      General: She is not in acute distress.    Appearance: She is well-developed. She is obese. She is not ill-appearing.  HENT:     Head: Normocephalic and atraumatic.     Right Ear: Tympanic membrane normal.     Left Ear: Tympanic membrane normal.     Nose: Nose normal.     Mouth/Throat:     Mouth: Mucous membranes are moist.     Pharynx: Oropharynx is clear.  Eyes:     Conjunctiva/sclera: Conjunctivae normal.  Cardiovascular:     Rate and Rhythm: Normal rate and regular rhythm.     Heart sounds: No murmur heard.   Pulmonary:      Effort: Pulmonary effort is normal. No respiratory distress.     Breath sounds: Normal breath sounds. No wheezing or rhonchi.  Abdominal:     Palpations: Abdomen is soft.     Tenderness: There is no abdominal tenderness. There is no guarding or rebound.  Musculoskeletal:     Cervical back: Neck supple.  Skin:    General: Skin is warm and dry.     Findings: Rash present.     Comments: Flesh-colored papular and patchy rash beneath both breasts.  No drainage.  Neurological:     General: No focal deficit present.     Mental Status: She is alert and oriented to person, place, and time.     Gait: Gait normal.  Psychiatric:        Mood and Affect: Mood normal.        Behavior: Behavior normal.      UC Treatments / Results  Labs (all labs ordered are listed, but only abnormal results are displayed) Labs Reviewed - No data to display  EKG   Radiology No results found.  Procedures Procedures (including critical care time)  Medications Ordered in UC Medications - No data to display  Initial Impression / Assessment and Plan / UC Course  I have reviewed the triage vital signs and the nursing notes.  Pertinent labs & imaging results that were available during my care of the patient were reviewed by me and considered in my medical decision making (see chart for details).   Candidal dermatitis.  Cough.  Treating with nystatin cream.  Instructed patient to take OTC allergy medication as needed for her allergy symptoms.  Instructed her to follow-up with her PCP if her symptoms are not improving.  Patient agrees to plan of care.   Final Clinical Impressions(s) / UC Diagnoses   Final diagnoses:  Candidal dermatitis  Cough     Discharge Instructions     Use the prescribed nystatin cream on the rash under your breast as directed.    Take over-the-counter allergy medication such as Zyrtec or  Allegra for your cough and allergy symptoms.    Follow up with your primary care  provider if your symptoms are not improving.       ED Prescriptions    Medication Sig Dispense Auth. Provider   nystatin cream (MYCOSTATIN) Apply to affected area 2 times daily 30 g Mickie Bail, NP     PDMP not reviewed this encounter.   Mickie Bail, NP 02/29/20 1344

## 2020-03-02 ENCOUNTER — Other Ambulatory Visit: Payer: Medicaid Other

## 2020-03-02 ENCOUNTER — Other Ambulatory Visit: Payer: Self-pay

## 2020-03-02 DIAGNOSIS — Z20822 Contact with and (suspected) exposure to covid-19: Secondary | ICD-10-CM

## 2020-03-03 LAB — NOVEL CORONAVIRUS, NAA: SARS-CoV-2, NAA: NOT DETECTED

## 2020-03-03 LAB — SARS-COV-2, NAA 2 DAY TAT

## 2020-03-13 ENCOUNTER — Other Ambulatory Visit: Payer: Self-pay

## 2020-03-13 ENCOUNTER — Telehealth: Payer: Self-pay

## 2020-03-13 ENCOUNTER — Ambulatory Visit
Admission: EM | Admit: 2020-03-13 | Discharge: 2020-03-13 | Disposition: A | Discharge status: Home / Self Care | Payer: Medicaid Other

## 2020-03-13 MED ORDER — NYSTATIN 100000 UNIT/GM EX CREA: TOPICAL_CREAM | CUTANEOUS | 0 refills | Status: DC

## 2020-03-13 NOTE — ED Triage Notes (Signed)
Pt returns for rash under breast, states she was unable to get medication because of cost

## 2020-03-13 NOTE — ED Notes (Signed)
Provider made aware and will resend medication to [facility], no need for visit, advised to call if any problems getting medication
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
## 2020-05-17 ENCOUNTER — Emergency Department
Admission: EM | Admit: 2020-05-17 | Discharge: 2020-05-17 | Disposition: A | Discharge status: Home / Self Care | Payer: Medicaid Other | Attending: Student in an Organized Health Care Education/Training Program | Admitting: Student in an Organized Health Care Education/Training Program

## 2020-05-17 ENCOUNTER — Emergency Department: Payer: Medicaid Other

## 2020-05-17 DIAGNOSIS — Z3201 Encounter for pregnancy test, result positive: Secondary | ICD-10-CM | POA: Diagnosis not present

## 2020-05-17 DIAGNOSIS — O99719 Diseases of the skin and subcutaneous tissue complicating pregnancy, unspecified trimester: Secondary | ICD-10-CM | POA: Diagnosis not present

## 2020-05-17 DIAGNOSIS — Z3A00 Weeks of gestation of pregnancy not specified: Secondary | ICD-10-CM | POA: Insufficient documentation

## 2020-05-17 DIAGNOSIS — O99519 Diseases of the respiratory system complicating pregnancy, unspecified trimester: Secondary | ICD-10-CM | POA: Insufficient documentation

## 2020-05-17 DIAGNOSIS — O99330 Smoking (tobacco) complicating pregnancy, unspecified trimester: Secondary | ICD-10-CM | POA: Insufficient documentation

## 2020-05-17 DIAGNOSIS — Z79899 Other long term (current) drug therapy: Secondary | ICD-10-CM | POA: Insufficient documentation

## 2020-05-17 DIAGNOSIS — F17200 Nicotine dependence, unspecified, uncomplicated: Secondary | ICD-10-CM | POA: Diagnosis not present

## 2020-05-17 DIAGNOSIS — L089 Local infection of the skin and subcutaneous tissue, unspecified: Secondary | ICD-10-CM | POA: Insufficient documentation

## 2020-05-17 DIAGNOSIS — J45909 Unspecified asthma, uncomplicated: Secondary | ICD-10-CM | POA: Diagnosis not present

## 2020-05-17 LAB — CBG MONITORING, ED: Glucose-Capillary: 88 mg/dL (ref 70–99)

## 2020-05-17 LAB — POC URINE PREG, ED: Preg Test, Ur: POSITIVE — AB

## 2020-05-17 MED ORDER — PRENATAL VITAMINS 28-0.8 MG PO TABS: 1.0000 | ORAL_TABLET | Freq: Every day | ORAL | 1 refills | Status: DC

## 2020-05-17 MED ORDER — CLINDAMYCIN HCL 300 MG PO CAPS
300.0000 mg | ORAL_CAPSULE | Freq: Three times a day (TID) | ORAL | 0 refills | Status: AC
Start: 2020-05-17 — End: 2020-05-27

## 2020-05-17 MED ORDER — CLINDAMYCIN PHOSPHATE 300 MG/2ML IJ SOLN
600.0000 mg | Freq: Once | INTRAMUSCULAR | Status: AC
  Administered 2020-05-17: 600 mg via INTRAMUSCULAR
  Filled 2020-05-17: qty 4

## 2020-05-17 NOTE — ED Notes (Addendum)
Called lab as [facility] out of wound culture swabs. Joni Reining states she will tube one soon for provider [address] to use. Pt notified urine sample needed.

## 2020-05-17 NOTE — ED Provider Notes (Signed)
[facility] Emergency Department Provider Note  ____________________________________________  Time seen: Approximately 5:56 PM  I have reviewed the triage vital signs and the nursing notes.   HISTORY  Chief Complaint Leg Pain    HPI Kristin Cisneros is a 19 y.o. female that presents to the emergency department for evaluation of right lower extremity wound for 1 month.  Patient has had several small infections to her right lower leg, but usually they heal.  This specific spot has created a wound to her right leg.  It has had some yellow drainage and she is concerned that it is infected.  Patient was seen by primary care in the middle of December and placed on 14 days of doxycycline.  Patient states that 1 morning, the doxycycline made her throw up so she quit taking it.  Patient had a positive home pregnancy test 2 weeks ago. LMP was 2 months ago.  Past Medical History:  Diagnosis Date  . Asthma   . Obesity     There are no problems to display for this patient.   No past surgical history on file.  Prior to Admission medications   Medication Sig Start Date End Date Taking? Authorizing Provider  clindamycin (CLEOCIN) 300 MG capsule Take 1 capsule (300 mg total) by mouth 3 (three) times daily for 10 days. 05/17/20 05/27/20 Yes Enid Derry, PA-C  Prenatal Vit-Fe Fumarate-FA (PRENATAL VITAMINS) 28-0.8 MG TABS Take 1 tablet by mouth daily. 05/17/20  Yes Enid Derry, PA-C  albuterol (VENTOLIN HFA) 108 (90 Base) MCG/ACT inhaler Inhale 1-2 puffs into the lungs every 6 (six) hours as needed for wheezing or shortness of breath.    [provider]  ibuprofen (ADVIL) 800 MG tablet Take 1 tablet (800 mg total) by mouth 3 (three) times daily. 01/24/20   Wieters, Hallie C, PA-C  nystatin cream (MYCOSTATIN) Apply to affected area 2 times daily 03/13/20   Avegno, Zachery Dakins, FNP  cetirizine (ZYRTEC) 10 MG tablet Take 1 tablet (10 mg total) by mouth daily. 09/30/19 01/24/20   Rennis Harding, PA-C    Allergies Patient has no known allergies.  Family History  Problem Relation Age of Onset  . Hypertension Mother     Social History Social History   Tobacco Use  . Smoking status: Current Some Day Smoker  . Smokeless tobacco: Never Used  Vaping Use  . Vaping Use: Never used  Substance Use Topics  . Alcohol use: No  . Drug use: No     Review of Systems  Constitutional: No fever/chills Cardiovascular: No chest pain. Respiratory: No SOB. Gastrointestinal: No abdominal pain.  No nausea, no vomiting.  Musculoskeletal: Negative for musculoskeletal pain. Skin: Negative for rash, abrasions, lacerations, ecchymosis. Positive for right leg wound. Neurological: Negative for headaches   ____________________________________________   PHYSICAL EXAM:  VITAL SIGNS: ED Triage Vitals  Enc Vitals Group     BP 05/17/20 1649 134/81     Pulse Rate 05/17/20 1649 (!) 105     Resp 05/17/20 1649 14     Temp 05/17/20 1649 98.6 F (37 C)     Temp Source 05/17/20 1649 Oral     SpO2 05/17/20 1649 100 %     Weight --      Height --      Head Circumference --      Peak Flow --      Pain Score 05/17/20 1650 7     Pain Loc --      Pain Edu? --  Excl. in GC? --      Constitutional: Alert and oriented. Well appearing and in no acute distress. Eyes: Conjunctivae are normal. PERRL. EOMI. Head: Atraumatic. ENT:      Ears:      Nose: No congestion/rhinnorhea.      Mouth/Throat: Mucous membranes are moist.  Neck: No stridor.   Cardiovascular: Normal rate, regular rhythm.  Good peripheral circulation. Respiratory: Normal respiratory effort without tachypnea or retractions. Lungs CTAB. Good air entry to the bases with no decreased or absent breath sounds. Gastrointestinal: Bowel sounds 4 quadrants. Soft and nontender to palpation. No guarding or rigidity. No palpable masses. No distention.  Musculoskeletal: Full range of motion to all extremities. No gross  deformities appreciated. Neurologic:  Normal speech and language. No gross focal neurologic deficits are appreciated.  Skin:  Skin is warm, dry.  1 cm x 1 cm wound to right lower leg with yellow drainage.  Several small healed wounds to the right lower leg. Psychiatric: Mood and affect are normal. Speech and behavior are normal. Patient exhibits appropriate insight and judgement.   ____________________________________________   LABS (all labs ordered are listed, but only abnormal results are displayed)  Labs Reviewed  POC URINE PREG, ED - Abnormal; Notable for the following components:      Result Value   Preg Test, Ur POSITIVE (*)    All other components within normal limits  AEROBIC CULTURE (SUPERFICIAL SPECIMEN)  CBG MONITORING, ED   ____________________________________________  EKG   ____________________________________________  RADIOLOGY   DG Tibia/Fibula Right  Result Date: 05/17/2020 CLINICAL DATA:  Right lower leg abscess at the midshaft. Difficulty bearing weight. EXAM: RIGHT TIBIA AND FIBULA - 2 VIEW COMPARISON:  None. FINDINGS: Slight soft tissue prominence anteriorly at the mid tibial level likely corresponding to the soft tissue abscess. No soft tissue gas or foreign body identified. Underlying bones appear intact. No evidence of acute fracture or dislocation. No bony changes to suggest osteomyelitis. IMPRESSION: Soft tissue abscess anteriorly at the mid tibial level. No complicating features identified. Electronically Signed   By: Burman Nieves M.D.   On: 05/17/2020 18:04    ____________________________________________    PROCEDURES  Procedure(s) performed:    Procedures    Medications  clindamycin (CLEOCIN) injection 600 mg (600 mg Intramuscular Given 05/17/20 1827)     ____________________________________________   INITIAL IMPRESSION / ASSESSMENT AND PLAN / ED COURSE  Pertinent labs & imaging results that were available during my care of  the patient were reviewed by me and considered in my medical decision making (see chart for details).  Review of the [place] CSRS was performed in accordance of the [facility] prior to dispensing any controlled drugs.   Patient's diagnosis is consistent with right leg wound.  Vital signs and exam are reassuring.  X-ray consistent with soft tissue abscess and no complicating features.  Area is draining.  Wound culture was sent.  Pregnancy test was positive.  Patient will be discharged home with prescriptions for clindamycin and prenatal vitamins. Patient is to follow up with primary care, dermatology, OB as directed. Patient is given ED precautions to return to the ED for any worsening or new symptoms.   Azariya Freeman was evaluated in Emergency Department on 05/17/2020 for the symptoms described in the history of present illness. She was evaluated in the context of the global COVID-19 pandemic, which necessitated consideration that the patient might be at risk for infection with the SARS-CoV-2 virus that causes COVID-19. Institutional protocols and algorithms that pertain  to the evaluation of patients at risk for COVID-19 are in a state of rapid change based on information released by regulatory bodies including the CDC and federal and state organizations. These policies and algorithms were followed during the patient's care in the ED.  ____________________________________________  FINAL CLINICAL IMPRESSION(S) / ED DIAGNOSES  Final diagnoses:  Skin infection  Positive pregnancy test      NEW MEDICATIONS STARTED DURING THIS VISIT:  ED Discharge Orders         Ordered    clindamycin (CLEOCIN) 300 MG capsule  3 times daily        05/17/20 1829    Prenatal Vit-Fe Fumarate-FA (PRENATAL VITAMINS) 28-0.8 MG TABS  Daily        05/17/20 1829              This chart was dictated using voice recognition software/Dragon. Despite best efforts to proofread, errors can occur which can change the meaning.  Any change was purely unintentional.    Enid Derry, PA-C 05/17/20 1857    Shaune Pollack, MD 05/22/20 [phone]

## 2020-05-17 NOTE — ED Notes (Addendum)
BG 88. See triage note. Circular wound noted to pt's R shin. No major drainage currently noted. Pt resting calmly in bed; resp reg/unlabored. Denies recent fevers.

## 2020-05-17 NOTE — ED Triage Notes (Signed)
Pt presents via POV c/o "large pimples" to to right lower extremity. Reports seen by PCP but unable to see dermatology at this time.

## 2020-05-21 LAB — AEROBIC CULTURE W GRAM STAIN (SUPERFICIAL SPECIMEN)

## 2020-06-07 ENCOUNTER — Ambulatory Visit
Admission: EM | Admit: 2020-06-07 | Discharge: 2020-06-07 | Disposition: A | Discharge status: Home / Self Care | Payer: Medicaid Other | Attending: Family Medicine | Admitting: Family Medicine

## 2020-06-07 ENCOUNTER — Encounter: Payer: Self-pay | Admitting: Emergency Medicine

## 2020-06-07 ENCOUNTER — Other Ambulatory Visit: Payer: Self-pay

## 2020-06-07 DIAGNOSIS — K5900 Constipation, unspecified: Secondary | ICD-10-CM

## 2020-06-07 DIAGNOSIS — Z331 Pregnant state, incidental: Secondary | ICD-10-CM

## 2020-06-07 MED ORDER — POLYETHYLENE GLYCOL 3350 17 GM/SCOOP PO POWD: 1.0000 | Freq: Once | ORAL | 0 refills | Status: AC

## 2020-06-07 NOTE — ED Provider Notes (Signed)
[facility]   [id number] 06/07/20 Arrival Time: 1049  CC: ABDOMINAL PAIN  SUBJECTIVE:  Kristin Cisneros is a 20 y.o. female who presents with no bowel movement for the last 3 days. Denies a precipitating event, trauma, close contacts with similar symptoms, recent travel or antibiotic use. Denies abdominal cramping. Has not taken OTC medications for this. Denies alleviating or aggravating factors. Denies similar symptoms in the past. Last BM 3 days ago.  Reports that she is pregnant, there is a positive pregnancy test from [facility] from 05/17/2020.  She is not sure how far along she is. She has not had her initial OB appointment yet.  She does have this scheduled though.  Denies abdominal pain, vaginal bleeding, other concerns, other symptoms  Denies fever, chills, appetite changes, weight changes, nausea, vomiting, chest pain, SOB, diarrhea, hematochezia, melena, dysuria, difficulty urinating, increased frequency or urgency, flank pain, loss of bladder function, vaginal discharge, vaginal odor, vaginal bleeding, dyspareunia, pelvic pain.     Patient's last menstrual period was 01/24/2020.  ROS: As per HPI.  All other pertinent ROS negative.     Past Medical History:  Diagnosis Date  . Asthma   . Obesity    History reviewed. No pertinent surgical history. No Known Allergies No current facility-administered medications on file prior to encounter.   Current Outpatient Medications on File Prior to Encounter  Medication Sig Dispense Refill  . albuterol (VENTOLIN HFA) 108 (90 Base) MCG/ACT inhaler Inhale 1-2 puffs into the lungs every 6 (six) hours as needed for wheezing or shortness of breath.    Marland Kitchen ibuprofen (ADVIL) 800 MG tablet Take 1 tablet (800 mg total) by mouth 3 (three) times daily. 21 tablet 0  . nystatin cream (MYCOSTATIN) Apply to affected area 2 times daily 30 g 0  . Prenatal Vit-Fe Fumarate-FA (PRENATAL VITAMINS) 28-0.8 MG TABS Take 1 tablet by mouth daily. 30  tablet 1  . [DISCONTINUED] cetirizine (ZYRTEC) 10 MG tablet Take 1 tablet (10 mg total) by mouth daily. 30 tablet 0   Social History   Socioeconomic History  . Marital status: Single    Spouse name: Not on file  . Number of children: Not on file  . Years of education: Not on file  . Highest education level: Not on file  Occupational History  . Not on file  Tobacco Use  . Smoking status: Current Some Day Smoker  . Smokeless tobacco: Never Used  Vaping Use  . Vaping Use: Never used  Substance and Sexual Activity  . Alcohol use: No  . Drug use: No  . Sexual activity: Not on file  Other Topics Concern  . Not on file  Social History Narrative  . Not on file   Social Determinants of Health   Financial Resource Strain: Not on file  Food Insecurity: Not on file  Transportation Needs: Not on file  Physical Activity: Not on file  Stress: Not on file  Social Connections: Not on file  Intimate Partner Violence: Not on file   Family History  Problem Relation Age of Onset  . Hypertension Mother      OBJECTIVE:  Vitals:   06/07/20 1216  BP: 138/82  Pulse: 92  Resp: 16  Temp: 98 F (36.7 C)  TempSrc: Oral  SpO2: 97%    General appearance: Alert; NAD HEENT: NCAT.  Oropharynx clear.  Lungs: clear to auscultation bilaterally without adventitious breath sounds Heart: regular rate and rhythm.  Radial pulses 2+ symmetrical bilaterally Abdomen: soft, non-distended;  normal active bowel sounds; non-tender to light and deep palpation; nontender at McBurney's point; negative Murphy's sign; negative rebound; no guarding Back: no CVA tenderness Extremities: no edema; symmetrical with no gross deformities Skin: warm and dry Neurologic: normal gait Psychological: alert and cooperative; normal mood and affect  LABS: No results found for this or any previous visit (from the past 24 hour(s)).  DIAGNOSTIC STUDIES: No results found.   ASSESSMENT & PLAN:  1. Constipation,  unspecified constipation type   2. Pregnant state, incidental     Meds ordered this encounter  Medications  . polyethylene glycol powder (MIRALAX) 17 GM/SCOOP powder    Sig: Take 255 g by mouth once for 1 dose.    Dispense:  255 g    Refill:  0    Order Specific Question:   Supervising Provider    Answer:   Merrilee Jansky [[id number]]    Prescribed MiraLAX May take 2 capfuls of this when you get it, be sure to drink plenty of fluids with this medication May take 2 capfuls again the next day, again with extra fluids If abdominal cramping begins, vaginal bleeding, other concerning symptoms occur, [facility] or [facility] If you experience new or worsening symptoms return or go to ER such as fever, chills, nausea, vomiting, diarrhea, bloody or dark tarry stools, constipation, urinary symptoms, worsening abdominal discomfort, symptoms that do not improve with medications, inability to keep fluids down.  Reviewed expectations re: course of current medical issues. Questions answered. Outlined signs and symptoms indicating need for more acute intervention. Patient verbalized understanding. After Visit Summary given.   Moshe Cipro, NP 06/10/20 [phone]

## 2020-06-07 NOTE — ED Triage Notes (Signed)
No bowel movement x 3 days.  Pt is pregnant not sure how far along.  Last period was sometime in November

## 2020-06-07 NOTE — Discharge Instructions (Signed)
You may take 2 capfuls when you get the medication. Drink plenty of fluids with this medication.  If there is still no bowel movement, you may take 2 more capfuls in the morning  Follow up with OB as needed

## 2020-06-10 DIAGNOSIS — Z3490 Encounter for supervision of normal pregnancy, unspecified, unspecified trimester: Secondary | ICD-10-CM | POA: Insufficient documentation

## 2020-10-29 ENCOUNTER — Other Ambulatory Visit: Payer: Self-pay | Admitting: Family Medicine

## 2020-10-29 DIAGNOSIS — Z3482 Encounter for supervision of other normal pregnancy, second trimester: Secondary | ICD-10-CM

## 2020-11-08 ENCOUNTER — Emergency Department (HOSPITAL_COMMUNITY)
Admission: EM | Admit: 2020-11-08 | Discharge: 2020-11-09 | Disposition: A | Discharge status: Home / Self Care | Payer: Medicaid Other | Attending: Emergency Medicine | Admitting: Emergency Medicine

## 2020-11-08 ENCOUNTER — Other Ambulatory Visit: Payer: Self-pay

## 2020-11-08 ENCOUNTER — Encounter (HOSPITAL_COMMUNITY): Payer: Self-pay | Admitting: *Deleted

## 2020-11-08 DIAGNOSIS — F17200 Nicotine dependence, unspecified, uncomplicated: Secondary | ICD-10-CM | POA: Insufficient documentation

## 2020-11-08 DIAGNOSIS — O26893 Other specified pregnancy related conditions, third trimester: Secondary | ICD-10-CM | POA: Insufficient documentation

## 2020-11-08 DIAGNOSIS — Z3A30 30 weeks gestation of pregnancy: Secondary | ICD-10-CM | POA: Insufficient documentation

## 2020-11-08 DIAGNOSIS — J45909 Unspecified asthma, uncomplicated: Secondary | ICD-10-CM | POA: Diagnosis not present

## 2020-11-08 DIAGNOSIS — H9201 Otalgia, right ear: Secondary | ICD-10-CM | POA: Insufficient documentation

## 2020-11-08 NOTE — ED Triage Notes (Signed)
Pt states she was outside today and swatted at a bug that was near her right ear and a few minutes later she felt something crawling in her ear; pt states she tried using a cotton tip and peroxide in the ear but still feels like something is in the ear

## 2020-11-09 NOTE — ED Provider Notes (Signed)
[facility] EMERGENCY DEPARTMENT Provider Note   CSN: [id number] Arrival date & time: 11/08/20  1958     History Chief Complaint  Patient presents with   Foreign Body in Ear    Possible bug in right ear     Kristin Cisneros is a 20 y.o. female.  The history is provided by the patient.  Foreign Body in Ear This is a new problem. The problem occurs constantly. The problem has not changed since onset.Nothing aggravates the symptoms. Nothing relieves the symptoms.  Patient reports she may have a bug in her ear.  She swatted at a bug near  her ear and soon after felt like something was crawling in it.  She has tried cotton tips and peroxide but still feels like there is something in the ear.  No fevers or vomiting.  No ear discharge.  She had otherwise been well.  She reports she is over [redacted] weeks pregnant, but no complications.  No abdominal pain or vaginal bleeding    Past Medical History:  Diagnosis Date   Asthma    Obesity     There are no problems to display for this patient.   History reviewed. No pertinent surgical history.   OB History     Gravida  1   Para      Term      Preterm      AB      Living  1      SAB      IAB      Ectopic      Multiple      Live Births              Family History  Problem Relation Age of Onset   Hypertension Mother     Social History   Tobacco Use   Smoking status: Some Days    Pack years: 0.00   Smokeless tobacco: Never  Vaping Use   Vaping Use: Never used  Substance Use Topics   Alcohol use: No   Drug use: No    Home Medications Prior to Admission medications   Medication Sig Start Date End Date Taking? Authorizing Provider  albuterol (VENTOLIN HFA) 108 (90 Base) MCG/ACT inhaler Inhale 1-2 puffs into the lungs every 6 (six) hours as needed for wheezing or shortness of breath.    [provider]  ibuprofen (ADVIL) 800 MG tablet Take 1 tablet (800 mg total) by mouth 3 (three) times daily. 01/24/20    Wieters, Hallie C, PA-C  nystatin cream (MYCOSTATIN) Apply to affected area 2 times daily 03/13/20   Avegno, Zachery Dakins, FNP  Prenatal Vit-Fe Fumarate-FA (PRENATAL VITAMINS) 28-0.8 MG TABS Take 1 tablet by mouth daily. 05/17/20   Enid Derry, PA-C  cetirizine (ZYRTEC) 10 MG tablet Take 1 tablet (10 mg total) by mouth daily. 09/30/19 01/24/20  Rennis Harding, PA-C    Allergies    Patient has no known allergies.  Review of Systems   Review of Systems  Constitutional:  Negative for fever.  Gastrointestinal:  Negative for vomiting.  Genitourinary:  Negative for vaginal bleeding.   Physical Exam Updated Vital Signs BP 109/72   Pulse 66   Temp 99.1 F (37.3 C) (Oral)   Resp 18   Ht 1.575 m (5\' 2" )   Wt 103.2 kg   LMP 01/24/2020   SpO2 97%   BMI 41.63 kg/m   Physical Exam CONSTITUTIONAL: Well developed/well nourished HEAD: Normocephalic/atraumatic EYES: EOMI/PERRL ENMT: Mucous membranes moist,  bilateral TMs are clear and intact.  No foreign bodies are noted in either ear.  Ears are symmetric.  No discharge or bleeding is noted from either ear NECK: supple no meningeal signs ABDOMEN: soft NEURO: Pt is awake/alert/appropriate, moves all extremitiesx4.  No facial droop.   EXTREMITIES: full ROM SKIN: warm, color normal PSYCH: no abnormalities of mood noted, alert and oriented to situation  ED Results / Procedures / Treatments   Labs (all labs ordered are listed, but only abnormal results are displayed) Labs Reviewed - No data to display  EKG None  Radiology No results found.  Procedures Procedures   Medications Ordered in ED Medications - No data to display  ED Course  I have reviewed the triage vital signs and the nursing notes.     MDM Rules/Calculators/A&P                          No foreign bodies noted.  Will discharge home Final Clinical Impression(s) / ED Diagnoses Final diagnoses:  Right ear pain    Rx / DC Orders ED Discharge Orders     None         Zadie Rhine, MD 11/09/20 [phone]

## 2020-11-29 ENCOUNTER — Ambulatory Visit: Payer: Medicaid Other | Attending: Family Medicine

## 2021-01-02 ENCOUNTER — Ambulatory Visit: Admission: RE | Admit: 2021-01-02 | Payer: Medicaid Other | Source: Ambulatory Visit

## 2021-02-25 ENCOUNTER — Encounter: Payer: Self-pay | Admitting: Emergency Medicine

## 2021-02-25 ENCOUNTER — Other Ambulatory Visit: Payer: Self-pay

## 2021-02-25 ENCOUNTER — Ambulatory Visit
Admission: EM | Admit: 2021-02-25 | Discharge: 2021-02-25 | Disposition: A | Discharge status: Home / Self Care | Payer: Medicaid Other | Attending: Internal Medicine | Admitting: Internal Medicine

## 2021-02-25 DIAGNOSIS — N760 Acute vaginitis: Secondary | ICD-10-CM | POA: Insufficient documentation

## 2021-02-25 MED ORDER — VALACYCLOVIR HCL 1 G PO TABS: 1000.0000 mg | ORAL_TABLET | Freq: Two times a day (BID) | ORAL | 0 refills | Status: AC

## 2021-02-25 MED ORDER — VALACYCLOVIR HCL 500 MG PO TABS: 1000.0000 mg | ORAL_TABLET | Freq: Two times a day (BID) | ORAL | Status: DC

## 2021-02-25 MED ORDER — LIDOCAINE (ANORECTAL) 5 % EX GEL: CUTANEOUS | 0 refills | Status: DC

## 2021-02-25 NOTE — ED Triage Notes (Signed)
Wants STD test. States she has a few sores around her vaginal and sores on labia area that are tender to the touch.  States the inside of her vagina hurts.  States she has been taking an antibiotic for a UTI and thinks this is helping.

## 2021-02-25 NOTE — Discharge Instructions (Addendum)
Apply lidocaine gel to the areas involved Please take medications as prescribed Return to urgent care if you have worsening symptoms We will call you with recommendations if labs are abnormal

## 2021-02-25 NOTE — ED Provider Notes (Signed)
[facility]    CSN: [id number] Arrival date & time: 02/25/21  1529      History   Chief Complaint No chief complaint on file.   HPI Kristin Cisneros is a 20 y.o. female comes to the urgent care with a few days history of painful vaginal sores.  Patient describes her symptoms as painful and tender to touch.  Started insidiously not been persistent.  Pain is constant, of moderate severity, aggravated by touching or movement.  No relieving factors noted.  No vaginal discharge.  No dysuria urgency or frequency.  Patient has vaginal pain when the urine get in contact with the labia.  No abdominal pain.  No oral ulcers.  HPI  Past Medical History:  Diagnosis Date   Asthma    Obesity     There are no problems to display for this patient.   History reviewed. No pertinent surgical history.  OB History     Gravida  1   Para      Term      Preterm      AB      Living  1      SAB      IAB      Ectopic      Multiple      Live Births               Home Medications    Prior to Admission medications   Medication Sig Start Date End Date Taking? Authorizing Provider  Lidocaine, Anorectal, 5 % GEL Apply to the affected areas as needed. 02/25/21  Yes Taleyah Hillman, Britta Mccreedy, MD  valACYclovir (VALTREX) 1000 MG tablet Take 1 tablet (1,000 mg total) by mouth 2 (two) times daily for 10 days. 02/25/21 03/07/21 Yes Josaiah Muhammed, Britta Mccreedy, MD  albuterol (VENTOLIN HFA) 108 (90 Base) MCG/ACT inhaler Inhale 1-2 puffs into the lungs every 6 (six) hours as needed for wheezing or shortness of breath.    [provider]  ibuprofen (ADVIL) 800 MG tablet Take 1 tablet (800 mg total) by mouth 3 (three) times daily. 01/24/20   Wieters, Hallie C, PA-C  cetirizine (ZYRTEC) 10 MG tablet Take 1 tablet (10 mg total) by mouth daily. 09/30/19 01/24/20  Rennis Harding, PA-C    Family History Family History  Problem Relation Age of Onset   Hypertension Mother     Social  History Social History   Tobacco Use   Smoking status: Some Days   Smokeless tobacco: Never  Vaping Use   Vaping Use: Never used  Substance Use Topics   Alcohol use: No   Drug use: No     Allergies   Patient has no known allergies.   Review of Systems Review of Systems  Gastrointestinal: Negative.   Genitourinary:  Positive for urgency and vaginal pain. Negative for dysuria, frequency, vaginal bleeding and vaginal discharge.  Musculoskeletal: Negative.     Physical Exam Triage Vital Signs ED Triage Vitals  Enc Vitals Group     BP 02/25/21 1627 115/79     Pulse Rate 02/25/21 1627 82     Resp 02/25/21 1627 18     Temp 02/25/21 1627 98 F (36.7 C)     Temp Source 02/25/21 1627 Oral     SpO2 02/25/21 1627 98 %     Weight --      Height --      Head Circumference --      Peak Flow --  Pain Score 02/25/21 1630 10     Pain Loc --      Pain Edu? --      Excl. in GC? --    No data found.  Updated Vital Signs BP 115/79 (BP Location: Right Arm)   Pulse 82   Temp 98 F (36.7 C) (Oral)   Resp 18   LMP 02/10/2021   SpO2 98%   Breastfeeding No   Visual Acuity Right Eye Distance:   Left Eye Distance:   Bilateral Distance:    Right Eye Near:   Left Eye Near:    Bilateral Near:     Physical Exam Vitals and nursing note reviewed. Exam conducted with a chaperone present.  Constitutional:      General: She is not in acute distress.    Appearance: She is not ill-appearing.  Cardiovascular:     Rate and Rhythm: Normal rate and regular rhythm.  Genitourinary:    Comments: Ulcerated rash in the labia majora bilaterally.  No labial swelling.  No significant vaginal discharge. Musculoskeletal:        General: No swelling or tenderness. Normal range of motion.  Neurological:     Mental Status: She is alert.     UC Treatments / Results  Labs (all labs ordered are listed, but only abnormal results are displayed) Labs Reviewed  HERPES SIMPLEX VIRUS(HSV) DNA  BY PCR  CERVICOVAGINAL ANCILLARY ONLY    EKG   Radiology No results found.  Procedures Procedures (including critical care time)  Medications Ordered in UC Medications - No data to display  Initial Impression / Assessment and Plan / UC Course  I have reviewed the triage vital signs and the nursing notes.  Pertinent labs & imaging results that were available during my care of the patient were reviewed by me and considered in my medical decision making (see chart for details).     1.  Painful vaginal rash: This is worrisome for genital herpes Herpes PCR test has been sent Cervicovaginal swab for GC/chlamydia/trichomonas/BV/vaginal yeast Lidocaine gel to be applied to the labia majora Valtrex 1000 mg twice daily for 10 days We will call patient with recommendations if labs are abnormal. Final Clinical Impressions(s) / UC Diagnoses   Final diagnoses:  Acute vaginitis     Discharge Instructions      Apply lidocaine gel to the areas involved Please take medications as prescribed Return to urgent care if you have worsening symptoms We will call you with recommendations if labs are abnormal   ED Prescriptions     Medication Sig Dispense Auth. Provider   Lidocaine, Anorectal, 5 % GEL Apply to the affected areas as needed. 30 g Kiegan Macaraeg, Britta Mccreedy, MD   valACYclovir (VALTREX) 1000 MG tablet Take 1 tablet (1,000 mg total) by mouth 2 (two) times daily for 10 days. 20 tablet Sukaina Toothaker, Britta Mccreedy, MD      PDMP not reviewed this encounter.   Merrilee Jansky, MD 02/25/21 [phone]

## 2021-02-26 ENCOUNTER — Telehealth (HOSPITAL_COMMUNITY): Payer: Self-pay | Admitting: Emergency Medicine

## 2021-02-26 LAB — CERVICOVAGINAL ANCILLARY ONLY
Bacterial Vaginitis (gardnerella): NEGATIVE
Candida Glabrata: NEGATIVE
Candida Vaginitis: NEGATIVE
Chlamydia: NEGATIVE
Comment: NEGATIVE
Comment: NEGATIVE
Comment: NEGATIVE
Comment: NEGATIVE
Comment: NEGATIVE
Comment: NORMAL
Neisseria Gonorrhea: NEGATIVE
Trichomonas: POSITIVE — AB

## 2021-02-26 MED ORDER — METRONIDAZOLE 500 MG PO TABS: 500.0000 mg | ORAL_TABLET | Freq: Two times a day (BID) | ORAL | 0 refills | Status: DC

## 2021-02-27 LAB — HSV DNA BY PCR (REFERENCE LAB)
HSV 1 DNA: NEGATIVE
HSV 2 DNA: POSITIVE — AB

## 2021-04-02 ENCOUNTER — Encounter (HOSPITAL_COMMUNITY): Payer: Self-pay

## 2021-04-02 ENCOUNTER — Other Ambulatory Visit: Payer: Self-pay

## 2021-04-02 ENCOUNTER — Emergency Department (HOSPITAL_COMMUNITY)
Admission: EM | Admit: 2021-04-02 | Discharge: 2021-04-02 | Disposition: A | Discharge status: Home / Self Care | Payer: Medicaid Other | Attending: Emergency Medicine | Admitting: Emergency Medicine

## 2021-04-02 DIAGNOSIS — J45909 Unspecified asthma, uncomplicated: Secondary | ICD-10-CM | POA: Insufficient documentation

## 2021-04-02 DIAGNOSIS — Z7982 Long term (current) use of aspirin: Secondary | ICD-10-CM | POA: Diagnosis not present

## 2021-04-02 DIAGNOSIS — Z20822 Contact with and (suspected) exposure to covid-19: Secondary | ICD-10-CM | POA: Diagnosis not present

## 2021-04-02 DIAGNOSIS — F17200 Nicotine dependence, unspecified, uncomplicated: Secondary | ICD-10-CM | POA: Diagnosis not present

## 2021-04-02 DIAGNOSIS — R059 Cough, unspecified: Secondary | ICD-10-CM | POA: Insufficient documentation

## 2021-04-02 DIAGNOSIS — M791 Myalgia, unspecified site: Secondary | ICD-10-CM | POA: Diagnosis not present

## 2021-04-02 DIAGNOSIS — J3489 Other specified disorders of nose and nasal sinuses: Secondary | ICD-10-CM | POA: Insufficient documentation

## 2021-04-02 DIAGNOSIS — R509 Fever, unspecified: Secondary | ICD-10-CM | POA: Diagnosis not present

## 2021-04-02 DIAGNOSIS — R6889 Other general symptoms and signs: Secondary | ICD-10-CM

## 2021-04-02 LAB — RESP PANEL BY RT-PCR (FLU A&B, COVID) ARPGX2
Influenza A by PCR: NEGATIVE
Influenza B by PCR: NEGATIVE
SARS Coronavirus 2 by RT PCR: NEGATIVE

## 2021-04-02 NOTE — ED Triage Notes (Addendum)
Pov from home with cc of possible covid. Her two children had covid 2 weeks ago and now she has been sick the last several days with cough congestion and fever (102 last night) 98.2 in triage and has not had any meds today. Wants a test and a md note

## 2021-04-02 NOTE — ED Provider Notes (Signed)
[facility] EMERGENCY DEPARTMENT Provider Note   CSN: [id number] Arrival date & time: 04/02/21  1811     History Chief Complaint  Patient presents with   COVID s/s    Kristin Cisneros is a 20 y.o. female with no significant past medical history presenting with a several day history of flulike symptoms including clear rhinorrhea, dry sounding cough, fever with T-max of 102 last night, generalized myalgias and fatigue.  She denies shortness of breath, chest pain, wheezing abdominal pain nausea or vomiting or urinary complaints.  Her 2 children were diagnosed with COVID last week and she is concerned about having this infection.  She has had no treatment for her symptoms prior to arrival although took some Tylenol last night which improved her fever.  The history is provided by the patient.      Past Medical History:  Diagnosis Date   Asthma    Obesity     There are no problems to display for this patient.   History reviewed. No pertinent surgical history.   OB History     Gravida  1   Para      Term      Preterm      AB      Living  1      SAB      IAB      Ectopic      Multiple      Live Births              Family History  Problem Relation Age of Onset   Hypertension Mother     Social History   Tobacco Use   Smoking status: Some Days   Smokeless tobacco: Never  Vaping Use   Vaping Use: Never used  Substance Use Topics   Alcohol use: No   Drug use: No    Home Medications Prior to Admission medications   Medication Sig Start Date End Date Taking? Authorizing Provider  acetaminophen (TYLENOL) 500 MG tablet Take by mouth. 01/02/21  Yes [provider]  aspirin 81 MG EC tablet Take by mouth. 06/27/20 06/27/21 Yes [provider]  ibuprofen (ADVIL) 600 MG tablet Take by mouth. 01/02/21  Yes [provider]  albuterol (VENTOLIN HFA) 108 (90 Base) MCG/ACT inhaler Inhale 1-2 puffs into the lungs every 6 (six) hours as  needed for wheezing or shortness of breath.    [provider]  Ferrous Sulfate (IRON) 325 (65 Fe) MG TABS Take 1 tablet by mouth every other day. 12/11/20   [provider]  ibuprofen (ADVIL) 800 MG tablet Take 1 tablet (800 mg total) by mouth 3 (three) times daily. 01/24/20   Wieters, Hallie C, PA-C  Lidocaine, Anorectal, 5 % GEL Apply to the affected areas as needed. 02/25/21   Lamptey, Britta Mccreedy, MD  metroNIDAZOLE (FLAGYL) 500 MG tablet Take 1 tablet (500 mg total) by mouth 2 (two) times daily. 02/26/21   Merrilee Jansky, MD  cetirizine (ZYRTEC) 10 MG tablet Take 1 tablet (10 mg total) by mouth daily. 09/30/19 01/24/20  Rennis Harding, PA-C    Allergies    Patient has no known allergies.  Review of Systems   Review of Systems  Constitutional:  Positive for fatigue and fever.  HENT:  Negative for congestion and sore throat.   Eyes: Negative.   Respiratory:  Positive for cough. Negative for chest tightness and shortness of breath.   Cardiovascular:  Negative for chest pain.  Gastrointestinal:  Negative  for abdominal pain, nausea and vomiting.  Genitourinary: Negative.   Musculoskeletal:  Negative for arthralgias, joint swelling and neck pain.  Skin: Negative.  Negative for rash and wound.  Neurological:  Negative for dizziness, weakness, light-headedness, numbness and headaches.  Psychiatric/Behavioral: Negative.    All other systems reviewed and are negative.  Physical Exam Updated Vital Signs BP 138/77   Pulse 98   Temp 98.2 F (36.8 C)   Resp 19   Wt 103.2 kg   SpO2 96%   BMI 41.61 kg/m   Physical Exam Vitals and nursing note reviewed.  Constitutional:      Appearance: She is well-developed.     Comments: Afebrile here.  She has not had any antipyretics today.  HENT:     Head: Normocephalic and atraumatic.  Eyes:     Conjunctiva/sclera: Conjunctivae normal.  Cardiovascular:     Rate and Rhythm: Normal rate and regular rhythm.     Heart sounds:  Normal heart sounds.  Pulmonary:     Effort: Pulmonary effort is normal. No respiratory distress.     Breath sounds: Normal breath sounds. No wheezing or rhonchi.  Abdominal:     General: Bowel sounds are normal.     Palpations: Abdomen is soft.     Tenderness: There is no abdominal tenderness. There is no guarding.  Musculoskeletal:        General: Normal range of motion.     Cervical back: Normal range of motion.  Lymphadenopathy:     Cervical: No cervical adenopathy.  Skin:    General: Skin is warm and dry.  Neurological:     General: No focal deficit present.     Mental Status: She is alert.    ED Results / Procedures / Treatments   Labs (all labs ordered are listed, but only abnormal results are displayed) Labs Reviewed  RESP PANEL BY RT-PCR (FLU A&B, COVID) ARPGX2    EKG None  Radiology No results found.  Procedures Procedures   Medications Ordered in ED Medications - No data to display  ED Course  I have reviewed the triage vital signs and the nursing notes.  Pertinent labs & imaging results that were available during my care of the patient were reviewed by me and considered in my medical decision making (see chart for details).    MDM Rules/Calculators/A&P                           Patient's respiratory panel including her COVID and influenza test is negative tonight.  However she does have symptoms suggesting of viral URI.  She has no shortness of breath, her respiratory exam is normal today.  Abdomen is soft, symptoms suggesting a viral URI.  Home instructions for symptom treatment were discussed.  Return precautions were discussed. Final Clinical Impression(s) / ED Diagnoses Final diagnoses:  Flu-like symptoms    Rx / DC Orders ED Discharge Orders     None        Victoriano Lain 04/02/21 2040    Benjiman Core, MD 04/03/21 1350

## 2021-04-02 NOTE — Discharge Instructions (Signed)
Your viral respiratory panel is negative today, however your symptoms do suggest a viral infection.  Rest to make sure you are drinking plenty of fluids.  I recommend taking Tylenol or Motrin for body aches and any return of your fever.  Get rechecked immediately for any shortness of breath, increased weakness or any new complaints.

## 2021-04-14 ENCOUNTER — Encounter (HOSPITAL_COMMUNITY): Payer: Self-pay | Admitting: Emergency Medicine

## 2021-04-14 ENCOUNTER — Other Ambulatory Visit: Payer: Self-pay

## 2021-04-14 ENCOUNTER — Emergency Department (HOSPITAL_COMMUNITY)
Admission: EM | Admit: 2021-04-14 | Discharge: 2021-04-14 | Disposition: A | Discharge status: Home / Self Care | Payer: Medicaid Other | Attending: Emergency Medicine | Admitting: Emergency Medicine

## 2021-04-14 DIAGNOSIS — Z20822 Contact with and (suspected) exposure to covid-19: Secondary | ICD-10-CM | POA: Insufficient documentation

## 2021-04-14 DIAGNOSIS — F17200 Nicotine dependence, unspecified, uncomplicated: Secondary | ICD-10-CM | POA: Diagnosis not present

## 2021-04-14 DIAGNOSIS — J101 Influenza due to other identified influenza virus with other respiratory manifestations: Secondary | ICD-10-CM | POA: Insufficient documentation

## 2021-04-14 DIAGNOSIS — J45909 Unspecified asthma, uncomplicated: Secondary | ICD-10-CM | POA: Insufficient documentation

## 2021-04-14 DIAGNOSIS — J111 Influenza due to unidentified influenza virus with other respiratory manifestations: Secondary | ICD-10-CM

## 2021-04-14 DIAGNOSIS — Z7982 Long term (current) use of aspirin: Secondary | ICD-10-CM | POA: Insufficient documentation

## 2021-04-14 DIAGNOSIS — R509 Fever, unspecified: Secondary | ICD-10-CM | POA: Diagnosis present

## 2021-04-14 LAB — RESP PANEL BY RT-PCR (FLU A&B, COVID) ARPGX2
Influenza A by PCR: POSITIVE — AB
Influenza B by PCR: NEGATIVE
SARS Coronavirus 2 by RT PCR: NEGATIVE

## 2021-04-14 MED ORDER — ACETAMINOPHEN 325 MG PO TABS
650.0000 mg | ORAL_TABLET | Freq: Once | ORAL | Status: AC
  Administered 2021-04-14: 18:00:00 650 mg via ORAL
  Filled 2021-04-14: qty 2

## 2021-04-14 NOTE — Discharge Instructions (Signed)
You have the flu  recommend over-the-counter pain medications like ibuprofen Tylenol for fever and pain control, nasal decongestions like Flonase and Zyrtec, Mucinex for cough.  If not eating recommend supplementing with Gatorade to help with electrolyte supplementation.  Follow-up PCP for further evaluation.  Come back to the emergency department if you develop chest pain, shortness of breath, severe abdominal pain, uncontrolled nausea, vomiting, diarrhea.

## 2021-04-14 NOTE — ED Provider Notes (Signed)
[facility] Provider Note   CSN: [id number] Arrival date & time: 04/14/21  1800     History Chief Complaint  Patient presents with   Cough    Kristin Cisneros is a 20 y.o. female.  HPI  Patient with no medical history presents with complaint of URI-like symptoms.  Patient states symptoms started yesterday, she endorses fevers, chills, headaches, nasal congestion, nonproductive cough, general body aches.  She denies throat pain, difficulty swallowing, change in voice, stomach pains, nausea, vomit, diarrhea, states she still tolerating p.o..  She denies any chest pain or shortness of breath, she is not immunocompromise, has not gotten her COVID or her influenza vaccines, she denies any recent sick contacts.  She has not take anything for pain, denies  alleviating or aggravating factors.  Past Medical History:  Diagnosis Date   Asthma    Obesity     There are no problems to display for this patient.   History reviewed. No pertinent surgical history.   OB History     Gravida  1   Para      Term      Preterm      AB      Living  1      SAB      IAB      Ectopic      Multiple      Live Births              Family History  Problem Relation Age of Onset   Hypertension Mother     Social History   Tobacco Use   Smoking status: Some Days   Smokeless tobacco: Never  Vaping Use   Vaping Use: Never used  Substance Use Topics   Alcohol use: No   Drug use: No    Home Medications Prior to Admission medications   Medication Sig Start Date End Date Taking? Authorizing Provider  acetaminophen (TYLENOL) 500 MG tablet Take by mouth. 01/02/21   [provider]  albuterol (VENTOLIN HFA) 108 (90 Base) MCG/ACT inhaler Inhale 1-2 puffs into the lungs every 6 (six) hours as needed for wheezing or shortness of breath.    [provider]  aspirin 81 MG EC tablet Take by mouth. 06/27/20 06/27/21  [provider]  Ferrous  Sulfate (IRON) 325 (65 Fe) MG TABS Take 1 tablet by mouth every other day. 12/11/20   [provider]  ibuprofen (ADVIL) 600 MG tablet Take by mouth. 01/02/21   [provider]  ibuprofen (ADVIL) 800 MG tablet Take 1 tablet (800 mg total) by mouth 3 (three) times daily. 01/24/20   Wieters, Hallie C, PA-C  Lidocaine, Anorectal, 5 % GEL Apply to the affected areas as needed. 02/25/21   Lamptey, Myrene Galas, MD  metroNIDAZOLE (FLAGYL) 500 MG tablet Take 1 tablet (500 mg total) by mouth 2 (two) times daily. 02/26/21   Chase Picket, MD  cetirizine (ZYRTEC) 10 MG tablet Take 1 tablet (10 mg total) by mouth daily. 09/30/19 01/24/20  Lestine Box, PA-C    Allergies    Patient has no known allergies.  Review of Systems   Review of Systems  Constitutional:  Positive for chills and fever.  HENT:  Positive for congestion. Negative for sore throat.   Respiratory:  Positive for cough. Negative for shortness of breath.   Cardiovascular:  Negative for chest pain.  Gastrointestinal:  Negative for abdominal pain, nausea and vomiting.  Genitourinary:  Negative for enuresis.  Musculoskeletal:  Positive for myalgias. Negative for back pain and neck pain.  Skin:  Negative for rash.  Neurological:  Positive for headaches. Negative for dizziness.  Hematological:  Does not bruise/bleed easily.   Physical Exam Updated Vital Signs BP 124/83   Pulse (!) 125   Temp 99.5 F (37.5 C) (Oral)   Resp 20   Ht 5\' 2"  (1.575 m)   Wt 101.7 kg   SpO2 95%   BMI 41.01 kg/m   Physical Exam Vitals and nursing note reviewed.  Constitutional:      General: She is not in acute distress.    Appearance: She is not ill-appearing.  HENT:     Head: Normocephalic and atraumatic.     Right Ear: Tympanic membrane, ear canal and external ear normal.     Left Ear: Tympanic membrane and ear canal normal.     Nose: Congestion present.     Mouth/Throat:     Mouth: Mucous membranes are moist.     Pharynx:  Oropharynx is clear. No oropharyngeal exudate or posterior oropharyngeal erythema.  Eyes:     Conjunctiva/sclera: Conjunctivae normal.  Cardiovascular:     Rate and Rhythm: Normal rate and regular rhythm.     Pulses: Normal pulses.     Heart sounds: No murmur heard.   No friction rub. No gallop.  Pulmonary:     Effort: No respiratory distress.     Breath sounds: No wheezing, rhonchi or rales.  Abdominal:     Palpations: Abdomen is soft.     Tenderness: There is no abdominal tenderness. There is no right CVA tenderness or left CVA tenderness.  Musculoskeletal:     Right lower leg: No edema.     Left lower leg: No edema.  Skin:    General: Skin is warm and dry.  Neurological:     Mental Status: She is alert.  Psychiatric:        Mood and Affect: Mood normal.    ED Results / Procedures / Treatments   Labs (all labs ordered are listed, but only abnormal results are displayed) Labs Reviewed  RESP PANEL BY RT-PCR (FLU A&B, COVID) ARPGX2 - Abnormal; Notable for the following components:      Result Value   Influenza A by PCR POSITIVE (*)    All other components within normal limits    EKG None  Radiology No results found.  Procedures Procedures   Medications Ordered in ED Medications  acetaminophen (TYLENOL) tablet 650 mg (650 mg Oral Given 04/14/21 1816)    ED Course  I have reviewed the triage vital signs and the nursing notes.  Pertinent labs & imaging results that were available during my care of the patient were reviewed by me and considered in my medical decision making (see chart for details).    MDM Rules/Calculators/A&P                          Initial impression-presents with URI-like symptoms, alert, no acute distress, vital signs reassuring on my evaluation.  Respiratory panel pending at this time.  Work-up-respiratory panel positive for influenza.  Rule out- Low suspicion for systemic infection as patient is nontoxic-appearing, vital signs  reassuring, no obvious source infection noted on exam.  Low suspicion for pneumonia as lung sounds are clear bilaterally, will defer imaging at this time.  I have low suspicion for PE as patient denies pleuritic chest pain, shortness of breath, vital signs reassuring, nontachypneic,  nonhypoxic.  She was noted be tachycardic at one point but this is likely secondary due to the fever. low suspicion for strep throat as oropharynx was visualized, no erythema or exudates noted.  Low suspicion patient would need  hospitalized due to viral infection or Covid as vital signs reassuring, patient is not in respiratory distress.    Plan-  Influenza-recommend symptom management, follow-up PCP for further evaluation, gave her strict return precautions.  Antiviral treatment will be deferred at this time as she not immunocompromise, has no comorbidities, mild symptoms low risk factors for adverse outcome.  Vital signs have remained stable, no indication for hospital admission.  Patient given at home care as well strict return precautions.  Patient verbalized that they understood agreed to said plan.  Final Clinical Impression(s) / ED Diagnoses Final diagnoses:  Influenza    Rx / DC Orders ED Discharge Orders     None        Carroll Sage, PA-C 04/14/21 2036    Pricilla Loveless, MD 04/17/21 1525

## 2021-04-14 NOTE — ED Triage Notes (Signed)
Pt here from home with c/o cough cold , body aches and congestion ,

## 2021-04-15 ENCOUNTER — Emergency Department (HOSPITAL_COMMUNITY)
Admission: EM | Admit: 2021-04-15 | Discharge: 2021-04-15 | Disposition: A | Discharge status: Home / Self Care | Payer: Medicaid Other | Attending: Emergency Medicine | Admitting: Emergency Medicine

## 2021-04-15 ENCOUNTER — Encounter (HOSPITAL_COMMUNITY): Payer: Self-pay | Admitting: *Deleted

## 2021-04-15 ENCOUNTER — Ambulatory Visit: Payer: Self-pay | Admitting: *Deleted

## 2021-04-15 DIAGNOSIS — R202 Paresthesia of skin: Secondary | ICD-10-CM | POA: Insufficient documentation

## 2021-04-15 DIAGNOSIS — O3680X0 Pregnancy with inconclusive fetal viability, not applicable or unspecified: Secondary | ICD-10-CM | POA: Insufficient documentation

## 2021-04-15 DIAGNOSIS — F17200 Nicotine dependence, unspecified, uncomplicated: Secondary | ICD-10-CM | POA: Insufficient documentation

## 2021-04-15 DIAGNOSIS — Z7982 Long term (current) use of aspirin: Secondary | ICD-10-CM | POA: Insufficient documentation

## 2021-04-15 DIAGNOSIS — R200 Anesthesia of skin: Secondary | ICD-10-CM

## 2021-04-15 DIAGNOSIS — J4520 Mild intermittent asthma, uncomplicated: Secondary | ICD-10-CM | POA: Diagnosis not present

## 2021-04-15 HISTORY — DX: Pregnancy with inconclusive fetal viability, not applicable or unspecified: O36.80X0

## 2021-04-15 LAB — BASIC METABOLIC PANEL
Anion gap: 8 (ref 5–15)
BUN: 10 mg/dL (ref 6–20)
CO2: 23 mmol/L (ref 22–32)
Calcium: 8.1 mg/dL — ABNORMAL LOW (ref 8.9–10.3)
Chloride: 102 mmol/L (ref 98–111)
Creatinine, Ser: 0.88 mg/dL (ref 0.44–1.00)
GFR, Estimated: >60 mL/min (ref >60)
Glucose, Bld: 101 mg/dL — ABNORMAL HIGH (ref 70–99)
Potassium: 3.1 mmol/L — ABNORMAL LOW (ref 3.5–5.1)
Sodium: 133 mmol/L — ABNORMAL LOW (ref 135–145)

## 2021-04-15 LAB — CBC WITH DIFFERENTIAL/PLATELET
Abs Immature Granulocytes: 0.02 10*3/uL (ref 0.00–0.07)
Basophils Absolute: 0 10*3/uL (ref 0.0–0.1)
Basophils Relative: 1 %
Eosinophils Absolute: 0 10*3/uL (ref 0.0–0.5)
Eosinophils Relative: 0 %
HCT: 32 % — ABNORMAL LOW (ref 36.0–46.0)
Hemoglobin: 10 g/dL — ABNORMAL LOW (ref 12.0–15.0)
Immature Granulocytes: 1 %
Lymphocytes Relative: 27 %
Lymphs Abs: 1.2 10*3/uL (ref 0.7–4.0)
MCH: 23 pg — ABNORMAL LOW (ref 26.0–34.0)
MCHC: 31.3 g/dL (ref 30.0–36.0)
MCV: 73.6 fL — ABNORMAL LOW (ref 80.0–100.0)
Monocytes Absolute: 0.4 10*3/uL (ref 0.1–1.0)
Monocytes Relative: 10 %
Neutro Abs: 2.6 10*3/uL (ref 1.7–7.7)
Neutrophils Relative %: 61 %
Platelets: 250 10*3/uL (ref 150–400)
RBC: 4.35 MIL/uL (ref 3.87–5.11)
RDW: 16.7 % — ABNORMAL HIGH (ref 11.5–15.5)
WBC: 4.3 10*3/uL (ref 4.0–10.5)
nRBC: 0 % (ref 0.0–0.2)

## 2021-04-15 MED ORDER — ACETAMINOPHEN 325 MG PO TABS
650.0000 mg | ORAL_TABLET | Freq: Once | ORAL | Status: AC
  Administered 2021-04-15: 650 mg via ORAL

## 2021-04-15 MED ORDER — POTASSIUM CHLORIDE CRYS ER 20 MEQ PO TBCR
40.0000 meq | EXTENDED_RELEASE_TABLET | Freq: Once | ORAL | Status: AC
  Administered 2021-04-15: 40 meq via ORAL

## 2021-04-15 MED ORDER — IBUPROFEN 800 MG PO TABS: 800.0000 mg | ORAL_TABLET | Freq: Once | ORAL | Status: DC

## 2021-04-15 NOTE — ED Triage Notes (Addendum)
States she has tingling in right arm, tingling feeling radiates from forearm to hand

## 2021-04-15 NOTE — Discharge Instructions (Signed)
Tylenol for discomfort.  Return if any problems.   

## 2021-04-15 NOTE — ED Notes (Signed)
Pt diagnosed with flu yesterday.

## 2021-04-15 NOTE — Telephone Encounter (Signed)
I returned pt's call.   She had called in earlier today c/o her right hand and some of her fingers being numb.   She was seen in the ED at [facility] last night and diagnosed with the flu.  The message also mentioned she is going to call her PCP this morning and let them know too.    This morning at about 4:00 AM she woke up and her right hand feels numb along with her right thumb, pointer finger and ring finger.   "It feels like it's asleep but the feeling hasn't come back".    She can use her hand and arm but "it feels a little weaker".     She denies getting any blood draws or injections in her right arm in the ED.    "I just got Tylenol and it was by mouth".     She called her PCP but they can't see her until tomorrow.   I instructed her to return to the ED for evaluation which she was agreeable to doing.

## 2021-04-15 NOTE — ED Provider Notes (Signed)
[facility] EMERGENCY DEPARTMENT Provider Note   CSN: [id number] Arrival date & time: 04/15/21  1430     History Chief Complaint  Patient presents with   numbness right hand     Kristin Cisneros is a 20 y.o. female.  Pt seen here yesterday and diagnosed with influenza.  Pt reports she woke this morning with tingling in her right hand.  Pt reports hand feels numb.  Pt reports fingers feel tight  The history is provided by the patient. No language interpreter was used.  Hand Pain This is a new problem. The current episode started 6 to 12 hours ago. The problem occurs constantly. The problem has not changed since onset.Nothing aggravates the symptoms. Nothing relieves the symptoms. She has tried nothing for the symptoms. The treatment provided no relief.      Past Medical History:  Diagnosis Date   Asthma    Obesity     Patient Active Problem List   Diagnosis Date Noted   Pregnancy with uncertain fetal viability 04/15/2021   Encounter for supervision of normal pregnancy 06/10/2020   Encounter for induction of labor 01/26/2017   History of chlamydia infection 01/26/2017   Mild intermittent asthma without complication 01/26/2017   NSVD (normal spontaneous vaginal delivery) 01/26/2017   Obesity due to excess calories 01/26/2017   Poor fetal growth affecting management of mother in third trimester 01/26/2017   Trichomonal vaginitis during pregnancy in third trimester 01/26/2017   Vaginal bleeding in pregnancy, third trimester 12/19/2016    History reviewed. No pertinent surgical history.   OB History     Gravida  1   Para      Term      Preterm      AB      Living  1      SAB      IAB      Ectopic      Multiple      Live Births              Family History  Problem Relation Age of Onset   Hypertension Mother     Social History   Tobacco Use   Smoking status: Some Days   Smokeless tobacco: Never  Vaping Use   Vaping Use: Never used   Substance Use Topics   Alcohol use: No   Drug use: No    Home Medications Prior to Admission medications   Medication Sig Start Date End Date Taking? Authorizing Provider  acetaminophen (TYLENOL) 500 MG tablet Take by mouth. 01/02/21  Yes [provider]  albuterol (VENTOLIN HFA) 108 (90 Base) MCG/ACT inhaler Inhale 1-2 puffs into the lungs every 6 (six) hours as needed for wheezing or shortness of breath.   Yes [provider]  valACYclovir (VALTREX) 1000 MG tablet Take 1,000 mg by mouth daily.   Yes [provider]  aspirin 81 MG EC tablet Take by mouth. Patient not taking: Reported on 04/15/2021 06/27/20 06/27/21  [provider]  Ferrous Sulfate (IRON) 325 (65 Fe) MG TABS Take 1 tablet by mouth every other day. Patient not taking: Reported on 04/15/2021 12/11/20   [provider]  ibuprofen (ADVIL) 600 MG tablet Take by mouth. Patient not taking: Reported on 04/15/2021 01/02/21   [provider]  ibuprofen (ADVIL) 800 MG tablet Take 1 tablet (800 mg total) by mouth 3 (three) times daily. Patient not taking: Reported on 04/15/2021 01/24/20   Wieters, Hallie C, PA-C  Lidocaine, Anorectal, 5 %  GEL Apply to the affected areas as needed. Patient not taking: Reported on 04/15/2021 02/25/21   Merrilee Jansky, MD  metroNIDAZOLE (FLAGYL) 500 MG tablet Take 1 tablet (500 mg total) by mouth 2 (two) times daily. Patient not taking: Reported on 04/15/2021 02/26/21   Merrilee Jansky, MD  cetirizine (ZYRTEC) 10 MG tablet Take 1 tablet (10 mg total) by mouth daily. 09/30/19 01/24/20  Rennis Harding, PA-C    Allergies    Patient has no known allergies.  Review of Systems   Review of Systems  All other systems reviewed and are negative.  Physical Exam Updated Vital Signs BP 121/62   Pulse 100   Temp 100.1 F (37.8 C) (Oral)   Resp 20   SpO2 97%   Physical Exam Vitals reviewed.  Constitutional:      Appearance: Normal appearance.   HENT:     Head: Normocephalic.  Cardiovascular:     Rate and Rhythm: Normal rate.     Pulses: Normal pulses.  Pulmonary:     Effort: Pulmonary effort is normal.  Musculoskeletal:        General: Tenderness present. No swelling. Normal range of motion.  Skin:    General: Skin is warm.  Neurological:     General: No focal deficit present.     Mental Status: She is alert.  Psychiatric:        Mood and Affect: Mood normal.    ED Results / Procedures / Treatments   Labs (all labs ordered are listed, but only abnormal results are displayed) Labs Reviewed  CBC WITH DIFFERENTIAL/PLATELET - Abnormal; Notable for the following components:      Result Value   Hemoglobin 10.0 (*)    HCT 32.0 (*)    MCV 73.6 (*)    MCH 23.0 (*)    RDW 16.7 (*)    All other components within normal limits  BASIC METABOLIC PANEL - Abnormal; Notable for the following components:   Sodium 133 (*)    Potassium 3.1 (*)    Glucose, Bld 101 (*)    Calcium 8.1 (*)    All other components within normal limits    EKG None  Radiology No results found.  Procedures Procedures   Medications Ordered in ED Medications  potassium chloride SA (KLOR-CON M) CR tablet 40 mEq (has no administration in time range)  acetaminophen (TYLENOL) tablet 650 mg (650 mg Oral Given 04/15/21 1534)    ED Course  I have reviewed the triage vital signs and the nursing notes.  Pertinent labs & imaging results that were available during my care of the patient were reviewed by me and considered in my medical decision making (see chart for details).    MDM Rules/Calculators/A&P                           MDM:  Potassium 3.1  Pt given kdur here.  I suspect pt may have slept on her hand causing paraesthesia.  Pt has a baby and car seat.  Final Clinical Impression(s) / ED Diagnoses Final diagnoses:  Numbness and tingling in right hand    Rx / DC Orders ED Discharge Orders     None        Osie Cheeks 04/15/21 1821    Milagros Loll, MD 04/15/21 2012

## 2021-04-15 NOTE — ED Triage Notes (Signed)
States she woke up around 4 am with numbness right arm

## 2021-04-15 NOTE — Telephone Encounter (Signed)
Reason for Disposition  [1] Numbness (loss of sensation) of finger(s) AND [2] present now  Answer Assessment - Initial Assessment Questions 1. MECHANISM: "How did the injury happen?"     I went to ED last night.   I was positive for flu.   I woke up and my thumb pointer finger and ring finger and up my arm is numb and I can't feel it.   I just woke up and it was like that.   2. ONSET: "When did the injury happen?" (Minutes or hours ago)      I woke up with my right hand and fingers numb.  4:00 AM.    I decide to wait and see if the feeling came back yet but it didn't.   It's never happened before.    3. APPEARANCE of INJURY: "What does the injury look like?"      No bruises.     No shots or blood draws last in right arm in ED. 4. SEVERITY: "Can you use the hand normally?" "Can you bend your fingers into a ball and then fully open them?"     It feels weak.   I called my PCP but they can't see me until tomorrow.   5. SIZE: For cuts, bruises, or swelling, ask: "How large is it?" (e.g., inches or centimeters;  entire hand or wrist)      No cuts, bruises, injuries.   "I just woke up with it numb". 6. PAIN: "Is there pain?" If Yes, ask: "How bad is the pain?"  (Scale 1-10; or mild, moderate, severe)     No pain.   "It feels like it's asleep but the feeling has never come back into my hand and fingers".  7. TETANUS: For any breaks in the skin, ask: "When was the last tetanus booster?"     NA 8. OTHER SYMPTOMS: "Do you have any other symptoms?"      No  She can use it but it feels weaker than normal. 9. PREGNANCY: "Is there any chance you are pregnant?" "When was your last menstrual period?"     Not asked  Protocols used: Hand and Wrist Injury-A-AH

## 2021-08-07 ENCOUNTER — Other Ambulatory Visit: Payer: Self-pay

## 2021-09-20 IMAGING — DX DG TIBIA/FIBULA 2V*R*
2 series · 2 of 2 positions shown · non-contrast
Comparison: None.

CLINICAL DATA: Right lower leg abscess at the midshaft. Difficulty
bearing weight.

EXAM:
RIGHT TIBIA AND FIBULA - 2 VIEW

[tibia ap]
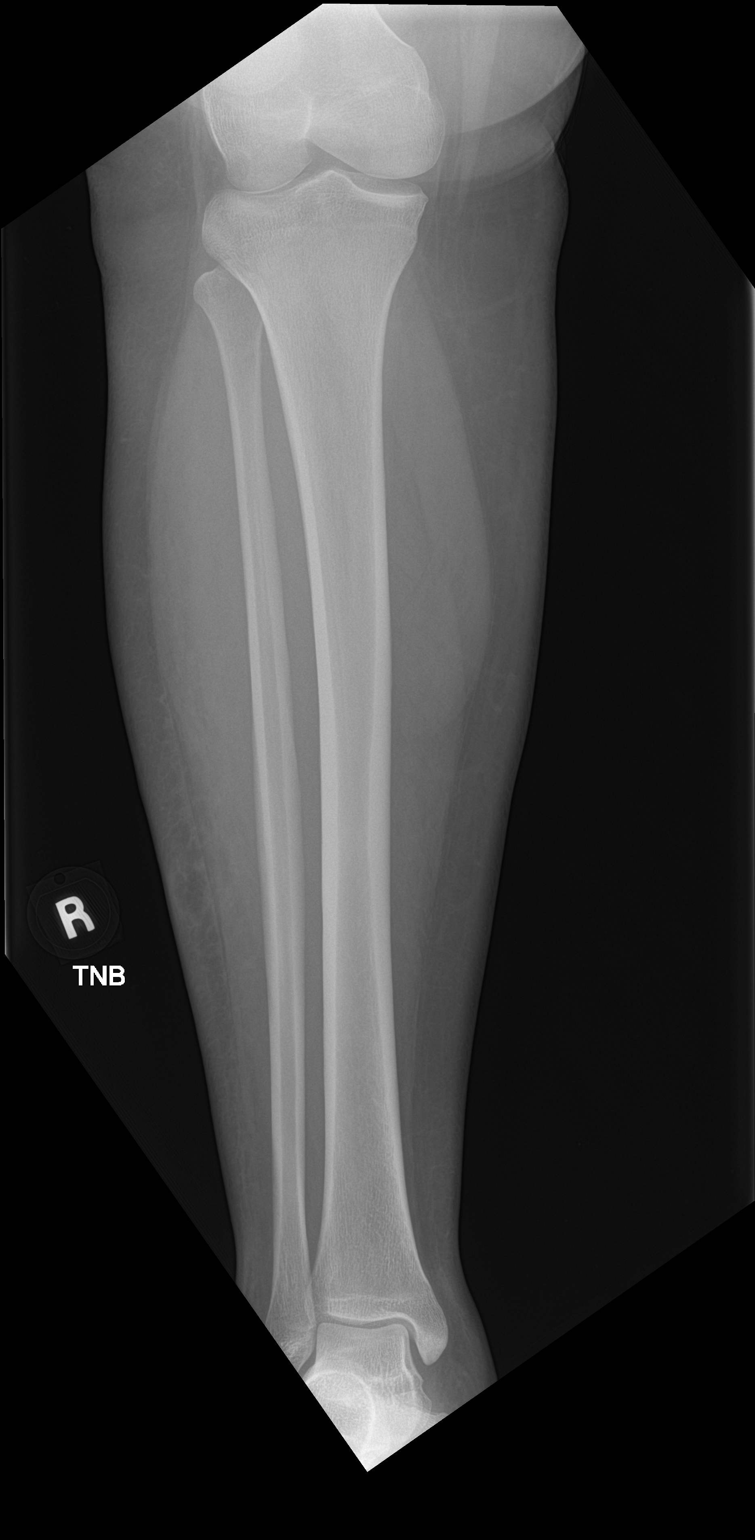

[tibia lat]
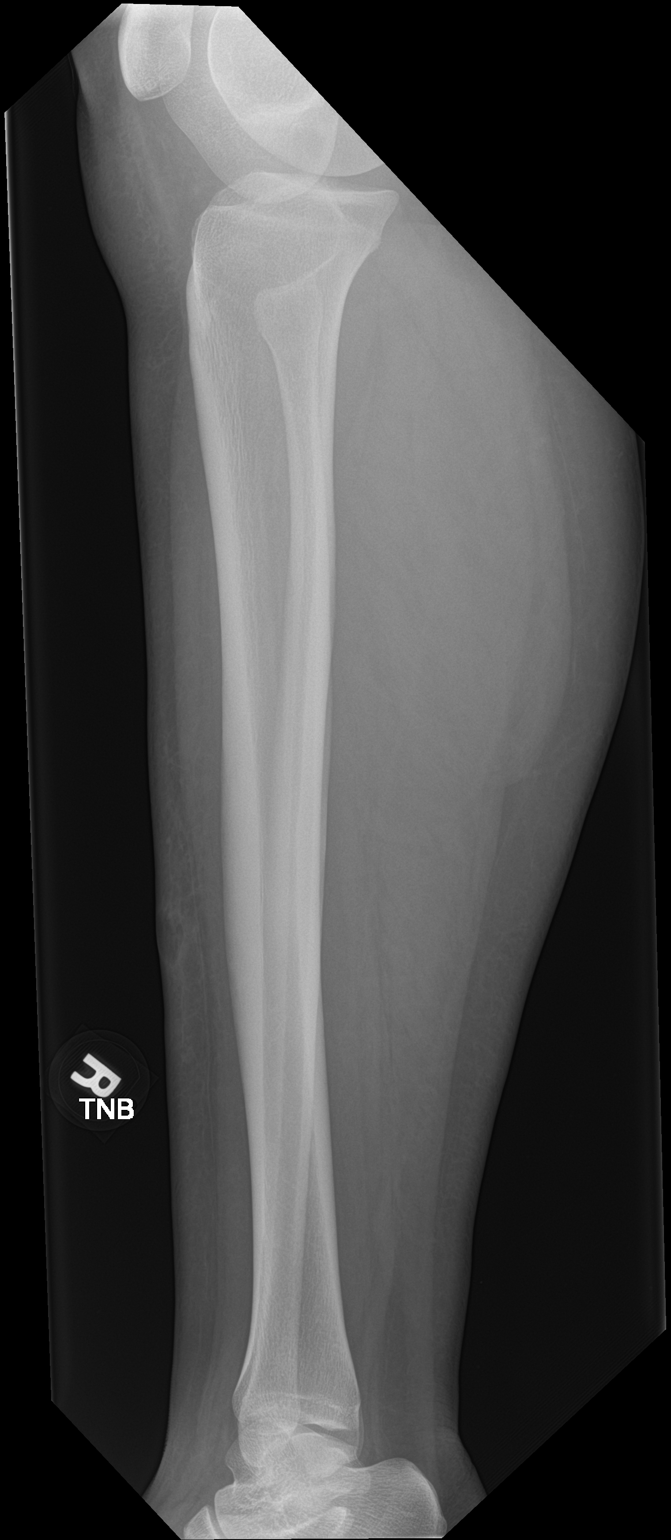

[2 of 2 positions shown; findings below may reference images not displayed]

FINDINGS: Slight soft tissue prominence anteriorly at the mid tibial level
likely corresponding to the soft tissue abscess. No soft tissue gas
or foreign body identified. Underlying bones appear intact. No
evidence of acute fracture or dislocation. No bony changes to
suggest osteomyelitis.
IMPRESSION: Soft tissue abscess anteriorly at the mid tibial level. No
complicating features identified.

## 2021-10-02 ENCOUNTER — Other Ambulatory Visit: Payer: Self-pay

## 2021-10-02 ENCOUNTER — Encounter (HOSPITAL_COMMUNITY): Payer: Self-pay | Admitting: Emergency Medicine

## 2021-10-02 ENCOUNTER — Emergency Department (HOSPITAL_COMMUNITY)
Admission: EM | Admit: 2021-10-02 | Discharge: 2021-10-02 | Disposition: A | Discharge status: Home / Self Care | Payer: Medicaid Other | Attending: Emergency Medicine | Admitting: Emergency Medicine

## 2021-10-02 DIAGNOSIS — R109 Unspecified abdominal pain: Secondary | ICD-10-CM | POA: Insufficient documentation

## 2021-10-02 DIAGNOSIS — R197 Diarrhea, unspecified: Secondary | ICD-10-CM | POA: Diagnosis not present

## 2021-10-02 DIAGNOSIS — R195 Other fecal abnormalities: Secondary | ICD-10-CM | POA: Diagnosis present

## 2021-10-02 DIAGNOSIS — A6000 Herpesviral infection of urogenital system, unspecified: Secondary | ICD-10-CM

## 2021-10-02 DIAGNOSIS — A09 Infectious gastroenteritis and colitis, unspecified: Secondary | ICD-10-CM

## 2021-10-02 HISTORY — DX: Herpesviral infection of urogenital system, unspecified: A60.00

## 2021-10-02 LAB — C DIFFICILE QUICK SCREEN W PCR REFLEX
C Diff antigen: NEGATIVE
C Diff interpretation: NOT DETECTED
C Diff toxin: NEGATIVE

## 2021-10-02 MED ORDER — METRONIDAZOLE 500 MG PO TABS
500.0000 mg | ORAL_TABLET | Freq: Once | ORAL | Status: DC
  Filled 2021-10-02: qty 1

## 2021-10-02 MED ORDER — ONDANSETRON 4 MG PO TBDP: 4.0000 mg | ORAL_TABLET | Freq: Three times a day (TID) | ORAL | 0 refills | Status: DC | PRN

## 2021-10-02 MED ORDER — CIPROFLOXACIN HCL 500 MG PO TABS: 500.0000 mg | ORAL_TABLET | Freq: Two times a day (BID) | ORAL | 0 refills | Status: DC

## 2021-10-02 MED ORDER — METRONIDAZOLE 500 MG PO TABS: 500.0000 mg | ORAL_TABLET | Freq: Three times a day (TID) | ORAL | 0 refills | Status: DC

## 2021-10-02 MED ORDER — CIPROFLOXACIN HCL 250 MG PO TABS
500.0000 mg | ORAL_TABLET | Freq: Once | ORAL | Status: DC
  Filled 2021-10-02: qty 2

## 2021-10-02 MED ORDER — ONDANSETRON 4 MG PO TBDP
4.0000 mg | ORAL_TABLET | Freq: Once | ORAL | Status: AC
  Administered 2021-10-02: 4 mg via ORAL
  Filled 2021-10-02: qty 1

## 2021-10-02 NOTE — ED Provider Notes (Signed)
[facility] EMERGENCY DEPARTMENT Provider Note   CSN: [id number] Arrival date & time: 10/02/21  0534     History  No chief complaint on file.   Kristin Cisneros is a 21 y.o. female.  Patient is a 21 year old female with no significant past medical history.  Patient presenting today with complaints of abdominal cramping, loose stools for the past several days.  Patient recently returned home from a trip to [place] where she drank bottled and tap water.  She describes an episode of bloody stool this morning.  She denies fevers at home, but was seen at [facility] 2 days ago and was found to be febrile there.  She underwent laboratory studies, CT scan, pregnancy test, and all were unremarkable.  It sounds as though patient left prior to receiving her final instructions and treatment recommendations.  She denies prior abdominal surgery or prior abdominal issues.  The history is provided by the patient.      Home Medications Prior to Admission medications   Medication Sig Start Date End Date Taking? Authorizing Provider  acetaminophen (TYLENOL) 500 MG tablet Take by mouth. 01/02/21   [provider]  albuterol (VENTOLIN HFA) 108 (90 Base) MCG/ACT inhaler Inhale 1-2 puffs into the lungs every 6 (six) hours as needed for wheezing or shortness of breath.    [provider]  Ferrous Sulfate (IRON) 325 (65 Fe) MG TABS Take 1 tablet by mouth every other day. Patient not taking: Reported on 04/15/2021 12/11/20   [provider]  ibuprofen (ADVIL) 600 MG tablet Take by mouth. Patient not taking: Reported on 04/15/2021 01/02/21   [provider]  ibuprofen (ADVIL) 800 MG tablet Take 1 tablet (800 mg total) by mouth 3 (three) times daily. Patient not taking: Reported on 04/15/2021 01/24/20   Wieters, Hallie C, PA-C  Lidocaine, Anorectal, 5 % GEL Apply to the affected areas as needed. Patient not taking: Reported on 04/15/2021 02/25/21   Merrilee Jansky, MD   metroNIDAZOLE (FLAGYL) 500 MG tablet Take 1 tablet (500 mg total) by mouth 2 (two) times daily. Patient not taking: Reported on 04/15/2021 02/26/21   Merrilee Jansky, MD  valACYclovir (VALTREX) 1000 MG tablet Take 1,000 mg by mouth daily.    [provider]  cetirizine (ZYRTEC) 10 MG tablet Take 1 tablet (10 mg total) by mouth daily. 09/30/19 01/24/20  Rennis Harding, PA-C      Allergies    Patient has no known allergies.    Review of Systems   Review of Systems  All other systems reviewed and are negative.  Physical Exam Updated Vital Signs BP 113/63 (BP Location: Left Arm)   Pulse 78   Temp 98.3 F (36.8 C)   Resp 16   Ht 5\' 2"  (1.575 m)   Wt 104.3 kg   LMP 07/30/2021 (Approximate)   SpO2 99%   BMI 42.07 kg/m  Physical Exam Vitals and nursing note reviewed.  Constitutional:      General: She is not in acute distress.    Appearance: She is well-developed. She is not diaphoretic.  HENT:     Head: Normocephalic and atraumatic.  Cardiovascular:     Rate and Rhythm: Normal rate and regular rhythm.     Heart sounds: No murmur heard.   No friction rub. No gallop.  Pulmonary:     Effort: Pulmonary effort is normal. No respiratory distress.     Breath sounds: Normal breath sounds. No wheezing.  Abdominal:     General:  Bowel sounds are normal. There is no distension.     Palpations: Abdomen is soft.     Tenderness: There is no abdominal tenderness.  Musculoskeletal:        General: Normal range of motion.     Cervical back: Normal range of motion and neck supple.  Skin:    General: Skin is warm and dry.  Neurological:     General: No focal deficit present.     Mental Status: She is alert and oriented to person, place, and time.    ED Results / Procedures / Treatments   Labs (all labs ordered are listed, but only abnormal results are displayed) Labs Reviewed - No data to display  EKG None  Radiology No results found.  Procedures Procedures     Medications Ordered in ED Medications - No data to display  ED Course/ Medical Decision Making/ A&P  Patient presenting with diarrhea that has since turned bloody.  She describes some abdominal cramping, but no persistent pain.  Her abdominal exam is benign, she is well-appearing, and vital signs are stable.  She just underwent work-up at [facility] and I do not feel as though this work-up needs to be repeated.  I highly suspect she has picked up some sort of traveler's diarrhea during her trip to [place].  I intend to collect a stool GI panel, but feel as though she can safely be discharged with Cipro/Flagyl pending culture reports.  Final Clinical Impression(s) / ED Diagnoses Final diagnoses:  None    Rx / DC Orders ED Discharge Orders     None         Geoffery Lyons, MD 10/02/21 [phone]

## 2021-10-02 NOTE — ED Notes (Signed)
ED Provider at bedside. 

## 2021-10-02 NOTE — Discharge Instructions (Addendum)
Begin taking Cipro and Flagyl as prescribed.  Follow-up with gastroenterology if symptoms are not improving in the next few days.  The contact information for Dr. Karilyn Cota has been provided in this discharge summary for you to call and make these arrangements.  Return to the emergency department in the meantime if you develop worsening pain, worsening bleeding, dizziness/lightheadedness, or for other new and concerning symptoms.

## 2021-10-02 NOTE — ED Triage Notes (Signed)
Pt went to [facility] on 5/16 for abdominal pain; test results were unremarkable. Pt just returned from [place] on 5/12. Pt noticed this morning that "blood was in my bowels". Pt states it was darker red in color with possible clots. Pt is still having abdominal pain that states, "more intense than menstrual cramps". Pt states she has diarrhea since 5/14 (when symptoms first started). Pt states feces is yellow in color. Pt last took tylenol 2200

## 2021-10-02 NOTE — ED Notes (Signed)
Pt attempting to use the bathroom at this time

## 2021-10-02 NOTE — ED Notes (Signed)
Pt attempted to take medication. Pt got choked up on flagyl pill and vomited it back up in the sink. Cipro medication is in medication cup on the counter. Pt still at sink spitting.

## 2021-10-03 LAB — GASTROINTESTINAL PANEL BY PCR, STOOL (REPLACES STOOL CULTURE)

## 2022-04-21 ENCOUNTER — Emergency Department (HOSPITAL_COMMUNITY)
Admission: EM | Admit: 2022-04-21 | Discharge: 2022-04-21 | Disposition: A | Discharge status: Home / Self Care | Payer: Medicaid Other | Attending: Emergency Medicine | Admitting: Emergency Medicine

## 2022-04-21 ENCOUNTER — Encounter (HOSPITAL_COMMUNITY): Payer: Self-pay

## 2022-04-21 ENCOUNTER — Other Ambulatory Visit: Payer: Self-pay

## 2022-04-21 DIAGNOSIS — R112 Nausea with vomiting, unspecified: Secondary | ICD-10-CM | POA: Insufficient documentation

## 2022-04-21 DIAGNOSIS — R197 Diarrhea, unspecified: Secondary | ICD-10-CM | POA: Insufficient documentation

## 2022-04-21 DIAGNOSIS — J45909 Unspecified asthma, uncomplicated: Secondary | ICD-10-CM | POA: Insufficient documentation

## 2022-04-21 LAB — CBC WITH DIFFERENTIAL/PLATELET
Abs Immature Granulocytes: 0.02 10*3/uL (ref 0.00–0.07)
Basophils Absolute: 0 10*3/uL (ref 0.0–0.1)
Basophils Relative: 0 %
Eosinophils Absolute: 0.1 10*3/uL (ref 0.0–0.5)
Eosinophils Relative: 1 %
HCT: 35.3 % — ABNORMAL LOW (ref 36.0–46.0)
Hemoglobin: 11.3 g/dL — ABNORMAL LOW (ref 12.0–15.0)
Immature Granulocytes: 0 %
Lymphocytes Relative: 23 %
Lymphs Abs: 1.2 10*3/uL (ref 0.7–4.0)
MCH: 24.8 pg — ABNORMAL LOW (ref 26.0–34.0)
MCHC: 32 g/dL (ref 30.0–36.0)
MCV: 77.6 fL — ABNORMAL LOW (ref 80.0–100.0)
Monocytes Absolute: 0.4 10*3/uL (ref 0.1–1.0)
Monocytes Relative: 7 %
Neutro Abs: 3.6 10*3/uL (ref 1.7–7.7)
Neutrophils Relative %: 69 %
Platelets: 135 10*3/uL — ABNORMAL LOW (ref 150–400)
RBC: 4.55 MIL/uL (ref 3.87–5.11)
RDW: 17.6 % — ABNORMAL HIGH (ref 11.5–15.5)
WBC: 5.3 10*3/uL (ref 4.0–10.5)
nRBC: 0 % (ref 0.0–0.2)

## 2022-04-21 LAB — COMPREHENSIVE METABOLIC PANEL
ALT: 14 U/L (ref 0–44)
AST: 21 U/L (ref 15–41)
Albumin: 3.9 g/dL (ref 3.5–5.0)
Alkaline Phosphatase: 107 U/L (ref 38–126)
Anion gap: 8 (ref 5–15)
BUN: 8 mg/dL (ref 6–20)
CO2: 24 mmol/L (ref 22–32)
Calcium: 8.8 mg/dL — ABNORMAL LOW (ref 8.9–10.3)
Chloride: 105 mmol/L (ref 98–111)
Creatinine, Ser: 0.64 mg/dL (ref 0.44–1.00)
GFR, Estimated: >60 mL/min (ref >60)
Glucose, Bld: 97 mg/dL (ref 70–99)
Potassium: 4.1 mmol/L (ref 3.5–5.1)
Sodium: 137 mmol/L (ref 135–145)
Total Bilirubin: 0.1 mg/dL — ABNORMAL LOW (ref 0.3–1.2)
Total Protein: 8 g/dL (ref 6.5–8.1)

## 2022-04-21 LAB — URINALYSIS, ROUTINE W REFLEX MICROSCOPIC
Bacteria, UA: NONE SEEN
Bilirubin Urine: NEGATIVE
Glucose, UA: NEGATIVE mg/dL
Ketones, ur: NEGATIVE mg/dL
Leukocytes,Ua: NEGATIVE
Nitrite: NEGATIVE
Protein, ur: NEGATIVE mg/dL
Specific Gravity, Urine: 1.012 (ref 1.005–1.030)
pH: 5 (ref 5.0–8.0)

## 2022-04-21 LAB — LIPASE, BLOOD: Lipase: 32 U/L (ref 11–51)

## 2022-04-21 LAB — POC URINE PREG, ED: Preg Test, Ur: NEGATIVE

## 2022-04-21 MED ORDER — ONDANSETRON 4 MG PO TBDP: 4.0000 mg | ORAL_TABLET | Freq: Three times a day (TID) | ORAL | 0 refills | Status: DC | PRN

## 2022-04-21 MED ORDER — ONDANSETRON HCL 4 MG/2ML IJ SOLN
4.0000 mg | Freq: Once | INTRAMUSCULAR | Status: AC
  Administered 2022-04-21: 4 mg via INTRAVENOUS
  Filled 2022-04-21: qty 2

## 2022-04-21 NOTE — ED Triage Notes (Signed)
"  Nauseated for about 3 days. Started vomiting this morning around 1am" per pt

## 2022-04-21 NOTE — ED Provider Notes (Signed)
[facility] EMERGENCY DEPARTMENT Provider Note   CSN: [id number] Arrival date & time: 04/21/22  7408     History {Add pertinent medical, surgical, social history, OB history to HPI:1} Chief Complaint  Patient presents with   Emesis    Kristin Cisneros is a 21 y.o. female.   Emesis Patient presents with nausea vomiting and some diarrhea.  Nausea started about 3 days ago.  Started vomiting around 1 in the morning last night.  Does have some upper abdominal pain with vomiting.  Feels better after the vomiting.  No fevers.  No chills.  No known sick contacts.    Past Medical History:  Diagnosis Date   Asthma    Herpes genitalia    Obesity     Home Medications Prior to Admission medications   Medication Sig Start Date End Date Taking? Authorizing Provider  acetaminophen (TYLENOL) 500 MG tablet Take by mouth. 01/02/21   [provider]  albuterol (VENTOLIN HFA) 108 (90 Base) MCG/ACT inhaler Inhale 1-2 puffs into the lungs every 6 (six) hours as needed for wheezing or shortness of breath.    [provider]  ciprofloxacin (CIPRO) 500 MG tablet Take 1 tablet (500 mg total) by mouth 2 (two) times daily. One po bid x 7 days 10/02/21   Geoffery Lyons, MD  Ferrous Sulfate (IRON) 325 (65 Fe) MG TABS Take 1 tablet by mouth every other day. Patient not taking: Reported on 04/15/2021 12/11/20   [provider]  ibuprofen (ADVIL) 600 MG tablet Take by mouth. Patient not taking: Reported on 04/15/2021 01/02/21   [provider]  ibuprofen (ADVIL) 800 MG tablet Take 1 tablet (800 mg total) by mouth 3 (three) times daily. Patient not taking: Reported on 04/15/2021 01/24/20   Wieters, Hallie C, PA-C  Lidocaine, Anorectal, 5 % GEL Apply to the affected areas as needed. Patient not taking: Reported on 04/15/2021 02/25/21   Merrilee Jansky, MD  metroNIDAZOLE (FLAGYL) 500 MG tablet Take 1 tablet (500 mg total) by mouth 3 (three) times daily. One po tid x 7 days 10/02/21    Geoffery Lyons, MD  ondansetron (ZOFRAN-ODT) 4 MG disintegrating tablet Take 1 tablet (4 mg total) by mouth every 8 (eight) hours as needed for nausea or vomiting. 10/02/21   Kommor, [place], MD  valACYclovir (VALTREX) 1000 MG tablet Take 1,000 mg by mouth daily.    [provider]  cetirizine (ZYRTEC) 10 MG tablet Take 1 tablet (10 mg total) by mouth daily. 09/30/19 01/24/20  Rennis Harding, PA-C      Allergies    Patient has no known allergies.    Review of Systems   Review of Systems  Gastrointestinal:  Positive for vomiting.    Physical Exam Updated Vital Signs BP 114/73   Pulse 80   Temp 99 F (37.2 C) (Oral)   Resp 17   Ht 5\' 2"  (1.575 m)   Wt 90.7 kg   SpO2 100%   BMI 36.58 kg/m  Physical Exam Vitals and nursing note reviewed.  Cardiovascular:     Rate and Rhythm: Regular rhythm.  Pulmonary:     Breath sounds: No wheezing.  Abdominal:     Tenderness: There is no abdominal tenderness.  Musculoskeletal:        General: No tenderness.  Neurological:     Mental Status: She is alert.     ED Results / Procedures / Treatments   Labs (all labs ordered are listed, but only abnormal results are displayed) Labs  Reviewed  URINALYSIS, ROUTINE W REFLEX MICROSCOPIC - Abnormal; Notable for the following components:      Result Value   Hgb urine dipstick SMALL (*)    All other components within normal limits  CBC WITH DIFFERENTIAL/PLATELET - Abnormal; Notable for the following components:   Hemoglobin 11.3 (*)    HCT 35.3 (*)    MCV 77.6 (*)    MCH 24.8 (*)    RDW 17.6 (*)    Platelets 135 (*)    All other components within normal limits  COMPREHENSIVE METABOLIC PANEL  LIPASE, BLOOD  POC URINE PREG, ED    EKG None  Radiology No results found.  Procedures Procedures  {Document cardiac monitor, telemetry assessment procedure when appropriate:1}  Medications Ordered in ED Medications  ondansetron (ZOFRAN) injection 4 mg (4 mg Intravenous Given 04/21/22  6962)    ED Course/ Medical Decision Making/ A&P                           Medical Decision Making Amount and/or Complexity of Data Reviewed Labs: ordered.  Risk Prescription drug management.   Patient with nausea vomiting diarrhea.  No known sick contacts.  Benign exam.  Differential diagnosis includes a gastroenteritis, infection, pregnancy. Patient is initial pregnancy test negative.  Will get basic blood work and give antiemetics.  Also considered other causes such as cholecystitis/biliary colic since there was an upper abdominal component.  {Document critical care time when appropriate:1} {Document review of labs and clinical decision tools ie heart score, Chads2Vasc2 etc:1}  {Document your independent review of radiology images, and any outside records:1} {Document your discussion with family members, caretakers, and with consultants:1} {Document social determinants of health affecting pt's care:1} {Document your decision making why or why not admission, treatments were needed:1} Final Clinical Impression(s) / ED Diagnoses Final diagnoses:  None    Rx / DC Orders ED Discharge Orders     None

## 2022-05-18 HISTORY — PX: WISDOM TOOTH EXTRACTION: SHX21

## 2022-06-10 ENCOUNTER — Other Ambulatory Visit: Payer: Self-pay

## 2022-06-10 ENCOUNTER — Emergency Department (HOSPITAL_COMMUNITY)
Admission: EM | Admit: 2022-06-10 | Discharge: 2022-06-10 | Disposition: A | Discharge status: Home / Self Care | Payer: Self-pay | Attending: Emergency Medicine | Admitting: Emergency Medicine

## 2022-06-10 ENCOUNTER — Encounter (HOSPITAL_COMMUNITY): Payer: Self-pay

## 2022-06-10 DIAGNOSIS — U071 COVID-19: Secondary | ICD-10-CM | POA: Insufficient documentation

## 2022-06-10 LAB — RESP PANEL BY RT-PCR (RSV, FLU A&B, COVID)  RVPGX2
Influenza A by PCR: NEGATIVE
Influenza B by PCR: NEGATIVE
Resp Syncytial Virus by PCR: NEGATIVE
SARS Coronavirus 2 by RT PCR: POSITIVE — AB

## 2022-06-10 LAB — GROUP A STREP BY PCR: Group A Strep by PCR: NOT DETECTED

## 2022-06-10 MED ORDER — BENZONATATE 100 MG PO CAPS
200.0000 mg | ORAL_CAPSULE | Freq: Once | ORAL | Status: AC
  Administered 2022-06-10: 200 mg via ORAL
  Filled 2022-06-10: qty 2

## 2022-06-10 MED ORDER — BENZONATATE 200 MG PO CAPS: 200.0000 mg | ORAL_CAPSULE | Freq: Three times a day (TID) | ORAL | 0 refills | Status: DC

## 2022-06-10 MED ORDER — IBUPROFEN 800 MG PO TABS
800.0000 mg | ORAL_TABLET | Freq: Once | ORAL | Status: AC
  Administered 2022-06-10: 800 mg via ORAL
  Filled 2022-06-10: qty 1

## 2022-06-10 MED ORDER — IBUPROFEN 800 MG PO TABS: 800.0000 mg | ORAL_TABLET | Freq: Three times a day (TID) | ORAL | 0 refills | Status: DC

## 2022-06-10 MED ORDER — LIDOCAINE VISCOUS HCL 2 % MT SOLN: 5.0000 mL | Freq: Three times a day (TID) | OROMUCOSAL | 0 refills | Status: DC | PRN

## 2022-06-10 NOTE — ED Provider Notes (Signed)
[place] EMERGENCY DEPARTMENT AT [facility] Provider Note   CSN: [id number] Arrival date & time: 06/10/22  1943     History  Chief Complaint  Patient presents with   Sore Throat    Kristin Cisneros is a 22 y.o. female.   Sore Throat Pertinent negatives include no chest pain, no abdominal pain, no headaches and no shortness of breath.        Kristin Cisneros is a 22 y.o. female who presents to the Emergency Department complaining of sore throat, nasal congestion, rhinorrhea, cough, intermittent dizziness and generalized bodyaches.  Symptoms present for 4 days.  Endorses pain with swallowing sore throat worse since yesterday.  She is having clear rhinorrhea.  Intermittent chills but no known fever.  Possible sick contact.  She denies chest pain or shortness of breath.  No abdominal pain vomiting or diarrhea.   Home Medications Prior to Admission medications   Medication Sig Start Date End Date Taking? Authorizing Provider  acetaminophen (TYLENOL) 500 MG tablet Take by mouth. 01/02/21   [provider]  albuterol (VENTOLIN HFA) 108 (90 Base) MCG/ACT inhaler Inhale 1-2 puffs into the lungs every 6 (six) hours as needed for wheezing or shortness of breath.    [provider]  ciprofloxacin (CIPRO) 500 MG tablet Take 1 tablet (500 mg total) by mouth 2 (two) times daily. One po bid x 7 days 10/02/21   Geoffery Lyons, MD  Ferrous Sulfate (IRON) 325 (65 Fe) MG TABS Take 1 tablet by mouth every other day. Patient not taking: Reported on 04/15/2021 12/11/20   [provider]  ibuprofen (ADVIL) 600 MG tablet Take by mouth. Patient not taking: Reported on 04/15/2021 01/02/21   [provider]  ibuprofen (ADVIL) 800 MG tablet Take 1 tablet (800 mg total) by mouth 3 (three) times daily. Patient not taking: Reported on 04/15/2021 01/24/20   Wieters, Hallie C, PA-C  Lidocaine, Anorectal, 5 % GEL Apply to the affected areas as needed. Patient not taking:  Reported on 04/15/2021 02/25/21   Merrilee Jansky, MD  metroNIDAZOLE (FLAGYL) 500 MG tablet Take 1 tablet (500 mg total) by mouth 3 (three) times daily. One po tid x 7 days 10/02/21   Geoffery Lyons, MD  ondansetron (ZOFRAN-ODT) 4 MG disintegrating tablet Take 1 tablet (4 mg total) by mouth every 8 (eight) hours as needed for nausea or vomiting. 04/21/22   Benjiman Core, MD  valACYclovir (VALTREX) 1000 MG tablet Take 1,000 mg by mouth daily.    [provider]  cetirizine (ZYRTEC) 10 MG tablet Take 1 tablet (10 mg total) by mouth daily. 09/30/19 01/24/20  Rennis Harding, PA-C      Allergies    Patient has no known allergies.    Review of Systems   Review of Systems  Constitutional:  Positive for chills and fatigue. Negative for appetite change.  HENT:  Positive for congestion, rhinorrhea and sore throat.   Respiratory:  Positive for cough. Negative for shortness of breath.   Cardiovascular:  Negative for chest pain.  Gastrointestinal:  Negative for abdominal pain, diarrhea, nausea and vomiting.  Genitourinary:  Negative for dysuria.  Musculoskeletal:  Positive for myalgias. Negative for arthralgias.  Neurological:  Positive for dizziness and weakness (Generalized weakness). Negative for numbness and headaches.    Physical Exam Updated Vital Signs BP (!) 129/91 (BP Location: Right Arm)   Pulse 81   Temp 98.7 F (37.1 C) (Oral)   Resp 16   Ht 5\' 2"  (1.575 m)  Wt 90 kg   LMP 05/06/2022 (Approximate)   SpO2 100%   BMI 36.29 kg/m  Physical Exam Vitals and nursing note reviewed.  Constitutional:      General: She is not in acute distress.    Appearance: She is well-developed. She is not toxic-appearing.  HENT:     Right Ear: Tympanic membrane and ear canal normal.     Left Ear: Tympanic membrane and ear canal normal.     Nose: Rhinorrhea present.     Mouth/Throat:     Mouth: Mucous membranes are moist.     Pharynx: Oropharynx is clear. No oropharyngeal exudate or  posterior oropharyngeal erythema.  Cardiovascular:     Rate and Rhythm: Normal rate and regular rhythm.  Pulmonary:     Effort: Pulmonary effort is normal. No respiratory distress.     Breath sounds: No wheezing.  Abdominal:     Palpations: Abdomen is soft.     Tenderness: There is no abdominal tenderness.  Musculoskeletal:        General: Normal range of motion.     Cervical back: Normal range of motion. No rigidity.  Skin:    General: Skin is warm.     Capillary Refill: Capillary refill takes less than 2 seconds.  Neurological:     General: No focal deficit present.     Mental Status: She is alert.     Sensory: No sensory deficit.     Motor: No weakness.     ED Results / Procedures / Treatments   Labs (all labs ordered are listed, but only abnormal results are displayed) Labs Reviewed  RESP PANEL BY RT-PCR (RSV, FLU A&B, COVID)  RVPGX2 - Abnormal; Notable for the following components:      Result Value   SARS Coronavirus 2 by RT PCR POSITIVE (*)    All other components within normal limits  GROUP A STREP BY PCR    EKG None  Radiology No results found.  Procedures Procedures    Medications Ordered in ED Medications  ibuprofen (ADVIL) tablet 800 mg (has no administration in time range)  benzonatate (TESSALON) capsule 200 mg (has no administration in time range)    ED Course/ Medical Decision Making/ A&P                             Medical Decision Making Patient here with symptoms suggestive of viral process.  Onset 4 days ago.  Upper respiratory symptoms with occasional cough.  No chest pain or shortness of breath.  No GI upset.  On exam, patient well-appearing nontoxic.  Hypertensive on arrival no history of hypertension.  Focal neurodeficits.  No meningeal signs.  Clinically, I suspect this is related to viral process.  Pneumonia and strep pharyngitis also considered but felt less likely.  Given patient's symptoms, I suspect flu versus COVID.  Will obtain  respiratory panel and strep PCR.  Amount and/or Complexity of Data Reviewed Labs: ordered.    Details: Respiratory panel positive for COVID, strep PCR negative Discussion of management or test interpretation with external provider(s): Discussed findings with patient.  Agreeable to symptomatic treatment.  Appropriate for discharge home, return precautions discussed.  Risk Prescription drug management.           Final Clinical Impression(s) / ED Diagnoses Final diagnoses:  COVID    Rx / DC Orders ED Discharge Orders     None  Kem Parkinson, PA-C 06/11/22 2310    Godfrey Pick, MD 06/15/22 [phone]

## 2022-06-10 NOTE — ED Triage Notes (Signed)
Pt presents to Ed with c/o sore throat, nasal congestion and "feeling bad" since Saturday.

## 2022-06-10 NOTE — Discharge Instructions (Signed)
Your COVID test this evening was positive.  I recommend that you rest, drink plenty of fluids.  You may also take Tylenol every 4 hours if needed for fever or bodyaches.  Please isolate at home for 5 days.  If after 5 days if you are feeling better and have been 24 hours without fever without taking fever reducing medications you may return to normal activities but please continue to wear a mask while around others for an additional 5 days.

## 2022-12-03 ENCOUNTER — Ambulatory Visit (INDEPENDENT_AMBULATORY_CARE_PROVIDER_SITE_OTHER): Payer: Medicaid Other

## 2022-12-03 VITALS — Wt 222.0 lb

## 2022-12-03 DIAGNOSIS — Z369 Encounter for antenatal screening, unspecified: Secondary | ICD-10-CM

## 2022-12-03 DIAGNOSIS — Z3689 Encounter for other specified antenatal screening: Secondary | ICD-10-CM

## 2022-12-03 DIAGNOSIS — Z3480 Encounter for supervision of other normal pregnancy, unspecified trimester: Secondary | ICD-10-CM | POA: Insufficient documentation

## 2022-12-03 NOTE — Patient Instructions (Signed)
First Trimester of Pregnancy  The first trimester of pregnancy starts on the first day of your last menstrual period until the end of week 12. This is also called months 1 through 3 of pregnancy. Body changes during your first trimester Your body goes through many changes during pregnancy. The changes usually return to normal after your baby is born. Physical changes You may gain or lose weight. Your breasts may grow larger and hurt. The area around your nipples may get darker. Dark spots or blotches may develop on your face. You may have changes in your hair. Health changes You may feel like you might vomit (nauseous), and you may vomit. You may have heartburn. You may have headaches. You may have trouble pooping (constipation). Your gums may bleed. Other changes You may get tired easily. You may pee (urinate) more often. Your menstrual periods will stop. You may not feel hungry. You may want to eat certain kinds of food. You may have changes in your emotions from day to day. You may have more dreams. Follow these instructions at home: Medicines Take over-the-counter and prescription medicines only as told by your doctor. Some medicines are not safe during pregnancy. Take a prenatal vitamin that contains at least 600 micrograms (mcg) of folic acid. Eating and drinking Eat healthy meals that include: Fresh fruits and vegetables. Whole grains. Good sources of protein, such as meat, eggs, or tofu. Low-fat dairy products. Avoid raw meat and unpasteurized juice, milk, and cheese. If you feel like you may vomit, or you vomit: Eat 4 or 5 small meals a day instead of 3 large meals. Try eating a few soda crackers. Drink liquids between meals instead of during meals. You may need to take these actions to prevent or treat trouble pooping: Drink enough fluids to keep your pee (urine) pale yellow. Eat foods that are high in fiber. These include beans, whole grains, and fresh fruits and  vegetables. Limit foods that are high in fat and sugar. These include fried or sweet foods. Activity Exercise only as told by your doctor. Most people can do their usual exercise routine during pregnancy. Stop exercising if you have cramps or pain in your lower belly (abdomen) or low back. Do not exercise if it is too hot or too humid, or if you are in a place of great height (high altitude). Avoid heavy lifting. If you choose to, you may have sex unless your doctor tells you not to. Relieving pain and discomfort Wear a good support bra if your breasts are sore. Rest with your legs raised (elevated) if you have leg cramps or low back pain. If you have bulging veins (varicose veins) in your legs: Wear support hose as told by your doctor. Raise your feet for 15 minutes, 3-4 times a day. Limit salt in your food. Safety Wear your seat belt at all times when you are in a car. Talk with your doctor if someone is hurting you or yelling at you. Talk with your doctor if you are feeling sad or have thoughts of hurting yourself. Lifestyle Do not use hot tubs, steam rooms, or saunas. Do not douche. Do not use tampons or scented sanitary pads. Do not use herbal medicines, illegal drugs, or medicines that are not approved by your doctor. Do not drink alcohol. Do not smoke or use any products that contain nicotine or tobacco. If you need help quitting, ask your doctor. Avoid cat litter boxes and soil that is used by cats. These carry   germs that can cause harm to the baby and can cause a loss of your baby by miscarriage or stillbirth. General instructions Keep all follow-up visits. This is important. Ask for help if you need counseling or if you need help with nutrition. Your doctor can give you advice or tell you where to go for help. Visit your dentist. At home, brush your teeth with a soft toothbrush. Floss gently. Write down your questions. Take them to your prenatal visits. Where to find more  information American Pregnancy Association: americanpregnancy.org American College of Obstetricians and Gynecologists: www.acog.org Office on Women's Health: womenshealth.gov/pregnancy Contact a doctor if: You are dizzy. You have a fever. You have mild cramps or pressure in your lower belly. You have a nagging pain in your belly area. You continue to feel like you may vomit, you vomit, or you have watery poop (diarrhea) for 24 hours or longer. You have a bad-smelling fluid coming from your vagina. You have pain when you pee. You are exposed to a disease that spreads from person to person, such as chickenpox, measles, Zika virus, HIV, or hepatitis. Get help right away if: You have spotting or bleeding from your vagina. You have very bad belly cramping or pain. You have shortness of breath or chest pain. You have any kind of injury, such as from a fall or a car crash. You have new or increased pain, swelling, or redness in an arm or leg. Summary The first trimester of pregnancy starts on the first day of your last menstrual period until the end of week 12 (months 1 through 3). Eat 4 or 5 small meals a day instead of 3 large meals. Do not smoke or use any products that contain nicotine or tobacco. If you need help quitting, ask your doctor. Keep all follow-up visits. This information is not intended to replace advice given to you by your health care provider. Make sure you discuss any questions you have with your health care provider. Document Revised: 10/11/2019 Document Reviewed: 08/17/2019 Elsevier Patient Education  2024 [employer]vier [employer]. Commonly Asked Questions During Pregnancy  Cats: A parasite can be excreted in cat feces.  To avoid exposure you need to have another person empty the little box.  If you must empty the litter box you will need to wear gloves.  Wash your hands after handling your cat.  This parasite can also be found in raw or undercooked meat so this should also be  avoided.  Colds, Sore Throats, Flu: Please check your medication sheet to see what you can take for symptoms.  If your symptoms are unrelieved by these medications please call the office.  Dental Work: Most any dental work your dentist recommends is permitted.  X-rays should only be taken during the first trimester if absolutely necessary.  Your abdomen should be shielded with a lead apron during all x-rays.  Please notify your provider prior to receiving any x-rays.  Novocaine is fine; gas is not recommended.  If your dentist requires a note from us prior to dental work please call the office and we will provide one for you.  Exercise: Exercise is an important part of staying healthy during your pregnancy.  You may continue most exercises you were accustomed to prior to pregnancy.  Later in your pregnancy you will most likely notice you have difficulty with activities requiring balance like riding a bicycle.  It is important that you listen to your body and avoid activities that put you at a higher   risk of falling.  Adequate rest and staying well hydrated are a must!  If you have questions about the safety of specific activities ask your provider.    Exposure to Children with illness: Try to avoid obvious exposure; report any symptoms to us when noted,  If you have chicken pos, red measles or mumps, you should be immune to these diseases.   Please do not take any vaccines while pregnant unless you have checked with your OB provider.  Fetal Movement: After 28 weeks we recommend you do "kick counts" twice daily.  Lie or sit down in a calm quiet environment and count your baby movements "kicks".  You should feel your baby at least 10 times per hour.  If you have not felt 10 kicks within the first hour get up, walk around and have something sweet to eat or drink then repeat for an additional hour.  If count remains less than 10 per hour notify your provider.  Fumigating: Follow your pest control agent's  advice as to how long to stay out of your home.  Ventilate the area well before re-entering.  Hemorrhoids:   Most over-the-counter preparations can be used during pregnancy.  Check your medication to see what is safe to use.  It is important to use a stool softener or fiber in your diet and to drink lots of liquids.  If hemorrhoids seem to be getting worse please call the office.   Hot Tubs:  Hot tubs Jacuzzis and saunas are not recommended while pregnant.  These increase your internal body temperature and should be avoided.  Intercourse:  Sexual intercourse is safe during pregnancy as long as you are comfortable, unless otherwise advised by your provider.  Spotting may occur after intercourse; report any bright red bleeding that is heavier than spotting.  Labor:  If you know that you are in labor, please go to the hospital.  If you are unsure, please call the office and let us help you decide what to do.  Lifting, straining, etc:  If your job requires heavy lifting or straining please check with your provider for any limitations.  Generally, you should not lift items heavier than that you can lift simply with your hands and arms (no back muscles)  Painting:  Paint fumes do not harm your pregnancy, but may make you ill and should be avoided if possible.  Latex or water based paints have less odor than oils.  Use adequate ventilation while painting.  Permanents & Hair Color:  Chemicals in hair dyes are not recommended as they cause increase hair dryness which can increase hair loss during pregnancy.  " Highlighting" and permanents are allowed.  Dye may be absorbed differently and permanents may not hold as well during pregnancy.  Sunbathing:  Use a sunscreen, as skin burns easily during pregnancy.  Drink plenty of fluids; avoid over heating.  Tanning Beds:  Because their possible side effects are still unknown, tanning beds are not recommended.  Ultrasound Scans:  Routine ultrasounds are performed  at approximately 20 weeks.  You will be able to see your baby's general anatomy an if you would like to know the gender this can usually be determined as well.  If it is questionable when you conceived you may also receive an ultrasound early in your pregnancy for dating purposes.  Otherwise ultrasound exams are not routinely performed unless there is a medical necessity.  Although you can request a scan we ask that you pay for it when   conducted because insurance does not cover " patient request" scans.  Work: If your pregnancy proceeds without complications you may work until your due date, unless your physician or employer advises otherwise.  Round Ligament Pain/Pelvic Discomfort:  Sharp, shooting pains not associated with bleeding are fairly common, usually occurring in the second trimester of pregnancy.  They tend to be worse when standing up or when you remain standing for long periods of time.  These are the result of pressure of certain pelvic ligaments called "round ligaments".  Rest, Tylenol and heat seem to be the most effective relief.  As the womb and fetus grow, they rise out of the pelvis and the discomfort improves.  Please notify the office if your pain seems different than that described.  It may represent a more serious condition.  Common Medications Safe in Pregnancy  Acne:      Constipation:  Benzoyl Peroxide     Colace  Clindamycin      Dulcolax Suppository  Topica Erythromycin     Fibercon  Salicylic Acid      Metamucil         Miralax AVOID:        Senakot   Accutane    Cough:  Retin-A       Cough Drops  Tetracycline      Phenergan w/ Codeine if Rx  Minocycline      Robitussin (Plain & DM)  Antibiotics:     Crabs/Lice:  Ceclor       RID  Cephalosporins    AVOID:  E-Mycins      Kwell  Keflex  Macrobid/Macrodantin   Diarrhea:  Penicillin      Kao-Pectate  Zithromax      Imodium AD         PUSH FLUIDS AVOID:       Cipro     Fever:  Tetracycline      Tylenol (Regular  or Extra  Minocycline       Strength)  Levaquin      Extra Strength-Do not          Exceed 8 tabs/24 hrs Caffeine:        <200mg/day (equiv. To 1 cup of coffee or  approx. 3 12 oz sodas)         Gas: Cold/Hayfever:       Gas-X  Benadryl      Mylicon  Claritin       Phazyme  **Claritin-D        Chlor-Trimeton    Headaches:  Dimetapp      ASA-Free Excedrin  Drixoral-Non-Drowsy     Cold Compress  Mucinex (Guaifenasin)     Tylenol (Regular or Extra  Sudafed/Sudafed-12 Hour     Strength)  **Sudafed PE Pseudoephedrine   Tylenol Cold & Sinus     Vicks Vapor Rub  Zyrtec  **AVOID if Problems With Blood Pressure         Heartburn: Avoid lying down for at least 1 hour after meals  Aciphex      Maalox     Rash:  Milk of Magnesia     Benadryl    Mylanta       1% Hydrocortisone Cream  Pepcid  Pepcid Complete   Sleep Aids:  Prevacid      Ambien   Prilosec       Benadryl  Rolaids       Chamomile Tea  Tums (Limit 4/day)     Unisom           Tylenol PM         Warm milk-add vanilla or  Hemorrhoids:       Sugar for taste  Anusol/Anusol H.C.  (RX: Analapram 2.5%)  Sugar Substitutes:  Hydrocortisone OTC     Ok in moderation  Preparation H      Tucks        Vaseline lotion applied to tissue with wiping    Herpes:     Throat:  Acyclovir      Oragel  Famvir  Valtrex     Vaccines:         Flu Shot Leg Cramps:       *Gardasil  Benadryl      Hepatitis A         Hepatitis B Nasal Spray:       Pneumovax  Saline Nasal Spray     Polio Booster         Tetanus Nausea:       Tuberculosis test or PPD  Vitamin B6 25 mg TID   AVOID:    Dramamine      *Gardasil  Emetrol       Live Poliovirus  Ginger Root 250 mg QID    MMR (measles, mumps &  High Complex Carbs @ Bedtime    rebella)  Sea Bands-Accupressure    Varicella (Chickenpox)  Unisom 1/2 tab TID     *No known complications           If received before Pain:         Known pregnancy;   Darvocet       Resume series  after  Lortab        Delivery  Percocet    Yeast:   Tramadol      Femstat  Tylenol 3      Gyne-lotrimin  Ultram       Monistat  Vicodin           MISC:         All Sunscreens           Hair Coloring/highlights          Insect Repellant's          (Including DEET)         Mystic Tans  

## 2022-12-03 NOTE — Progress Notes (Addendum)
New OB Intake  I connected with  Kristin Cisneros on 12/03/22 at  9:15 AM EDT by telephone and verified that I am speaking with the correct person using two identifiers. Nurse is located at [facility] and pt is located at home.  I explained I am completing New OB Intake today. We discussed her EDD of 07/22/2023 that is based on LMP of 05/30/224. Pt is G3/P1102. Pt states she isn't really sure how many weeks along she was when she delivered second child - 36wks is a guestimation.  I reviewed her allergies, medications, Medical/Surgical/OB history, and appropriate screenings. There are no cats in the home.  Based on history, this is a/an pregnancy uncomplicated . Of note, the FOB wanted pt to have an abortion as he doesn't want the baby.  He doesn't know she is seeking prenatal care. She states no one is taking her baby away from her.  She's going to have this baby.  Patient Active Problem List   Diagnosis Date Noted   Supervision of other normal pregnancy, antepartum 12/03/2022   Herpes genitalia 10/02/2021   Pregnancy with uncertain fetal viability 04/15/2021   Encounter for supervision of normal pregnancy 06/10/2020   Encounter for induction of labor 01/26/2017   History of chlamydia infection 01/26/2017   Mild intermittent asthma without complication 01/26/2017   NSVD (normal spontaneous vaginal delivery) 01/26/2017   Obesity due to excess calories 01/26/2017   Poor fetal growth affecting management of mother in third trimester 01/26/2017   Trichomonal vaginitis during pregnancy in third trimester 01/26/2017   Vaginal bleeding in pregnancy, third trimester 12/19/2016    Concerns addressed today  Delivery Plans:  Plans to deliver at [facility]; aware [facility] is the only hospital we deliver at; aware she may have someone deliver her that she hasn't met before.  Anatomy US Explained secondscheduled [place] will be 12/15/22 at [facility]; and an anatomy scan will be  done at 20 weeks.  Labs Discussed genetic screening with patient. Patient desires genetic testing as well as DNA testing to be drawn at new OB visit. Discussed possible labs to be drawn at new OB appointment.  COVID Vaccine Patient has had COVID vaccine.   Social Determinants of Health Food Insecurity: denies food insecurity.  Transportation: Patient denies transportation needs. Childcare: Discussed no children allowed at ultrasound appointments.   First visit review I reviewed new OB appt with pt. I explained she will have ob bloodwork and pap smear/pelvic exam if indicated. Explained pt will be seen by an AOB provider at first visit; encounter routed to appropriate provider.   Loran Senters, [facility] 12/03/2022  9:52 AM

## 2022-12-23 ENCOUNTER — Other Ambulatory Visit: Payer: Self-pay | Admitting: Licensed Practical Nurse

## 2022-12-23 ENCOUNTER — Ambulatory Visit (INDEPENDENT_AMBULATORY_CARE_PROVIDER_SITE_OTHER): Payer: Medicaid Other

## 2022-12-23 DIAGNOSIS — Z3687 Encounter for antenatal screening for uncertain dates: Secondary | ICD-10-CM

## 2022-12-23 DIAGNOSIS — Z3480 Encounter for supervision of other normal pregnancy, unspecified trimester: Secondary | ICD-10-CM

## 2022-12-23 DIAGNOSIS — Z3A09 9 weeks gestation of pregnancy: Secondary | ICD-10-CM | POA: Diagnosis not present

## 2022-12-23 DIAGNOSIS — Z369 Encounter for antenatal screening, unspecified: Secondary | ICD-10-CM

## 2023-01-13 ENCOUNTER — Other Ambulatory Visit (HOSPITAL_COMMUNITY)
Admission: RE | Admit: 2023-01-13 | Discharge: 2023-01-13 | Disposition: A | Discharge status: Home / Self Care | Payer: Medicaid Other | Source: Ambulatory Visit | Attending: Obstetrics | Admitting: Obstetrics

## 2023-01-13 ENCOUNTER — Ambulatory Visit (INDEPENDENT_AMBULATORY_CARE_PROVIDER_SITE_OTHER): Payer: Medicaid Other | Admitting: Obstetrics

## 2023-01-13 ENCOUNTER — Encounter: Payer: Self-pay | Admitting: Obstetrics

## 2023-01-13 VITALS — BP 103/66 | HR 76 | Wt 217.0 lb

## 2023-01-13 DIAGNOSIS — Z3009 Encounter for other general counseling and advice on contraception: Secondary | ICD-10-CM

## 2023-01-13 DIAGNOSIS — Z0283 Encounter for blood-alcohol and blood-drug test: Secondary | ICD-10-CM

## 2023-01-13 DIAGNOSIS — Z3A12 12 weeks gestation of pregnancy: Secondary | ICD-10-CM

## 2023-01-13 DIAGNOSIS — Z113 Encounter for screening for infections with a predominantly sexual mode of transmission: Secondary | ICD-10-CM

## 2023-01-13 DIAGNOSIS — Z1379 Encounter for other screening for genetic and chromosomal anomalies: Secondary | ICD-10-CM

## 2023-01-13 DIAGNOSIS — O0990 Supervision of high risk pregnancy, unspecified, unspecified trimester: Secondary | ICD-10-CM | POA: Diagnosis present

## 2023-01-13 DIAGNOSIS — Z6839 Body mass index (BMI) 39.0-39.9, adult: Secondary | ICD-10-CM

## 2023-01-13 DIAGNOSIS — Z131 Encounter for screening for diabetes mellitus: Secondary | ICD-10-CM

## 2023-01-13 DIAGNOSIS — Z0184 Encounter for antibody response examination: Secondary | ICD-10-CM

## 2023-01-13 DIAGNOSIS — Z3481 Encounter for supervision of other normal pregnancy, first trimester: Secondary | ICD-10-CM

## 2023-01-13 DIAGNOSIS — D649 Anemia, unspecified: Secondary | ICD-10-CM

## 2023-01-13 DIAGNOSIS — Z13220 Encounter for screening for lipoid disorders: Secondary | ICD-10-CM

## 2023-01-13 MED ORDER — ALBUTEROL SULFATE HFA 108 (90 BASE) MCG/ACT IN AERS: 1.0000 | INHALATION_SPRAY | Freq: Four times a day (QID) | RESPIRATORY_TRACT | 3 refills | Status: AC | PRN

## 2023-01-13 MED ORDER — ASPIRIN 81 MG PO TBEC: 162.0000 mg | DELAYED_RELEASE_TABLET | Freq: Every day | ORAL | 6 refills | Status: AC

## 2023-01-13 MED ORDER — ONDANSETRON HCL 4 MG PO TABS: 4.0000 mg | ORAL_TABLET | Freq: Three times a day (TID) | ORAL | 0 refills | Status: AC | PRN

## 2023-01-13 NOTE — Progress Notes (Signed)
NEW OB HISTORY AND PHYSICAL  SUBJECTIVE:       Kristin Cisneros is a 22 y.o. [id number] female, Patient's last menstrual period was 10/15/2022 (approximate)., Estimated Date of Delivery: 07/22/23, [redacted]w[redacted]d, presents today for establishment of Prenatal Care. She reports nausea and food aversions. She has some SOB at work when she is walking a lot but has not had to use her inhaler yet this pregnancy. She has a h/o 2 vaginal births. Her first was induced for FGR and her second was induced for oligohydramnios. 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  Social history Partner/Relationship: FOB involved - was not initially supportive of the pregnancy but is okay now Living situation: lives with children; feels safe Work: Copy at [employer] Exercise: walking, stairs Substance use: quit smoking a week ago; denies other substance use   Gynecologic History Patient's last menstrual period was 10/15/2022 (approximate).  H/o irregular periods and likely PCOS Contraception: none Last Pap: ~approx 6 months ago per pt. Results were: ACUS - needs repeat PP  Obstetric History OB History  Gravida Para Term Preterm AB Living  3 2 1 1   2   SAB IAB Ectopic Multiple Live Births          2    # Outcome Date GA Lbr Len/2nd Weight Sex Type Anes PTL Lv  3 Current           2 Preterm 01/01/21 [redacted]w[redacted]d   F Vag-Spont  N LIV  1 Term 01/26/17 [redacted]w[redacted]d  5 lb 6 oz (2.438 kg) M Vag-Spont  N LIV    Past Medical History:  Diagnosis Date   Asthma    Herpes genitalia    History of chlamydia infection 01/26/2017   NSVD (normal spontaneous vaginal delivery) 01/26/2017   Obesity    Poor fetal growth affecting management of mother in third trimester 01/26/2017   Pregnancy with uncertain fetal viability 04/15/2021    Past Surgical History:  Procedure Laterality Date   WISDOM TOOTH EXTRACTION  2024   two or three    Current Outpatient Medications on File Prior to  Visit  Medication Sig Dispense Refill   Prenatal Vit-Fe Fumarate-FA (PRENATAL MULTIVITAMIN) TABS tablet Take 1 tablet by mouth daily at 12 noon.     valACYclovir (VALTREX) 1000 MG tablet Take 1,000 mg by mouth daily.     [DISCONTINUED] cetirizine (ZYRTEC) 10 MG tablet Take 1 tablet (10 mg total) by mouth daily. 30 tablet 0   No current facility-administered medications on file prior to visit.    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  Tobacco Use   Smoking status: Every Day    Current packs/day: 0.25    Types: Cigarettes   Smokeless tobacco: Never   Tobacco comments:    Trying to quit  Vaping Use   Vaping status: Never Used  Substance and Sexual Activity   Alcohol use: No   Drug use: No   Sexual activity: Yes    Partners: Male    Birth control/protection: None  Other Topics Concern   Not on file  Social History Narrative   Not on file   Social Determinants of Health  Financial Resource Strain: Low Risk  (12/03/2022)   Overall Financial Resource Strain (CARDIA)    Difficulty of Paying Living Expenses: Not very hard  Food Insecurity: No Food Insecurity (12/03/2022)   Hunger Vital Sign    Worried About Running Out of Food in the Last Year: Never true    Ran Out of Food in the Last Year: Never true  Transportation Needs: No Transportation Needs (12/03/2022)   PRAPARE - Administrator, Civil Service (Medical): No    Lack of Transportation (Non-Medical): No  Physical Activity: Inactive (12/03/2022)   Exercise Vital Sign    Days of Exercise per Week: 0 days    Minutes of Exercise per Session: 0 min  Stress: No Stress Concern Present (12/03/2022)   [employer] of Occupational Health - Occupational Stress Questionnaire     Feeling of Stress : Not at all  Social Connections: Unknown (12/03/2022)   Social Connection and Isolation Panel [NHANES]    Frequency of Communication with Friends and Family: More than three times a week    Frequency of Social Gatherings with Friends and Family: Twice a week    Attends Religious Services: Never    Database administrator or Organizations: No    Attends Banker Meetings: Never    Marital Status: Not on file  Intimate Partner Violence: Not At Risk (12/03/2022)   Humiliation, Afraid, Rape, and Kick questionnaire    Fear of Current or Ex-Partner: No    Emotionally Abused: No    Physically Abused: No    Sexually Abused: No    Family History  Problem Relation Age of Onset   Stroke Mother    Hypertension Mother    Healthy Father    Healthy Sister    Healthy Sister    Healthy Sister    Healthy Brother    Healthy Brother    Healthy Brother    Healthy Brother     The following portions of the patient's history were reviewed and updated as appropriate: allergies, current medications, past OB history, past medical history, past surgical history, past family history, past social history, and problem list.  Constitutional: Denied constitutional symptoms, night sweats, recent illness, fatigue, fever, insomnia and weight loss.  Eyes: Denied eye symptoms, eye pain, photophobia, vision change and visual disturbance.  Ears/Nose/Throat/Neck: Denied ear, nose, throat or neck symptoms, hearing loss, nasal discharge, sinus congestion and sore throat.  Cardiovascular: Denied cardiovascular symptoms, arrhythmia, chest pain/pressure, edema, exercise intolerance, orthopnea and palpitations.  Respiratory: Denied pulmonary symptoms, pleuritic pain, productive sputum, cough, dyspnea and wheezing. +h/o asthma, has not needed inhaler in several years  Gastrointestinal: Denied, gastro-esophageal reflux, melena, and vomiting. +nausea  Genitourinary: Denied genitourinary symptoms  including symptomatic vaginal discharge, pelvic relaxation issues, and urinary complaints.  Musculoskeletal: Denied musculoskeletal symptoms, stiffness, swelling, muscle weakness and myalgia.  Dermatologic: Denied dermatology symptoms, rash and scar.  Neurologic: Denied neurology symptoms, dizziness, headache, neck pain and syncope.  Psychiatric: Denied psychiatric symptoms, anxiety and depression.  Endocrine: Denied endocrine symptoms including hot flashes and night sweats.     OBJECTIVE: Initial Physical Exam (New OB)  GENERAL APPEARANCE: alert, well appearing HEAD: normocephalic, atraumatic MOUTH: mucous membranes moist, pharynx normal without lesions THYROID: no thyromegaly or masses present BREASTS: no masses noted, no significant tenderness, no palpable axillary nodes, no skin changes LUNGS: clear to auscultation, no wheezes, rales or rhonchi, symmetric air entry HEART: regular rate and rhythm, no murmurs ABDOMEN: soft, nontender, nondistended, no abnormal masses, no  epigastric pain and fundus soft, 2FB above SP. FHT not heard with Doppler. 143 bpm by [place]. EXTREMITIES: no redness or tenderness in the calves or thighs SKIN: normal coloration and turgor, no rashes LYMPH NODES: no adenopathy palpable NEUROLOGIC: alert, oriented, normal speech, no focal findings or movement disorder noted  PELVIC EXAM declined  ASSESSMENT: Normal pregnancy [redacted]w[redacted]d Body mass index is 39.69 kg/m.    PLAN: Routine prenatal care. We discussed an overview of prenatal care and when to call. Reviewed diet, exercise, and weight gain recommendations in pregnancy. Discussed benefits of breastfeeding and lactation resources at [facility]. Labs today. Discussed recommendations for antenatal surveillance and delivery timing if BMI>40. She does plan to transfer her care to [facility] and birth at [facility]. Rx sent for ASA 162 mg, zofran, and inhaler per pt request. She plans to start HSV suppression ~28-32 weeks d/t h/o  of long outbreaks.  See orders  Guadlupe Spanish, CNM

## 2023-01-13 NOTE — Addendum Note (Signed)
Addended by: Loney Laurence on: 01/13/2023 04:09 PM   Modules accepted: Orders

## 2023-01-14 LAB — CBC/D/PLT+RPR+RH+ABO+RUBIGG...
Antibody Screen: NEGATIVE
Basophils Absolute: 0 10*3/uL (ref 0.0–0.2)
Basos: 0 %
EOS (ABSOLUTE): 0.1 10*3/uL (ref 0.0–0.4)
Eos: 2 %
HCV Ab: NONREACTIVE
HIV Screen 4th Generation wRfx: NONREACTIVE
Hematocrit: 35 % (ref 34.0–46.6)
Hemoglobin: 11.6 g/dL (ref 11.1–15.9)
Hepatitis B Surface Ag: NEGATIVE
Immature Grans (Abs): 0 10*3/uL (ref 0.0–0.1)
Immature Granulocytes: 0 %
Lymphocytes Absolute: 1.3 10*3/uL (ref 0.7–3.1)
Lymphs: 29 %
MCH: 26.4 pg — ABNORMAL LOW (ref 26.6–33.0)
MCHC: 33.1 g/dL (ref 31.5–35.7)
MCV: 80 fL (ref 79–97)
Monocytes Absolute: 0.2 10*3/uL (ref 0.1–0.9)
Monocytes: 5 %
Neutrophils Absolute: 2.8 10*3/uL (ref 1.4–7.0)
Neutrophils: 64 %
Platelets: 120 10*3/uL — ABNORMAL LOW (ref 150–450)
RBC: 4.39 x10E6/uL (ref 3.77–5.28)
RDW: 16.1 % — ABNORMAL HIGH (ref 11.7–15.4)
RPR Ser Ql: NONREACTIVE
Rh Factor: POSITIVE
Rubella Antibodies, IGG: 5.97 {index} (ref >0.99)
Varicella zoster IgG: 229 {index} (ref >165)
WBC: 4.4 10*3/uL (ref 3.4–10.8)

## 2023-01-14 LAB — URINALYSIS, ROUTINE W REFLEX MICROSCOPIC
Bilirubin, UA: NEGATIVE
Glucose, UA: NEGATIVE
Leukocytes,UA: NEGATIVE
Nitrite, UA: NEGATIVE
RBC, UA: NEGATIVE
Specific Gravity, UA: >=1.03 — AB (ref 1.005–1.030)
Urobilinogen, Ur: 0.2 mg/dL (ref 0.2–1.0)
pH, UA: 6 (ref 5.0–7.5)

## 2023-01-14 LAB — TSH PREGNANCY: TSH Pregnancy: 0.898 u[IU]/mL (ref 0.450–4.500)

## 2023-01-14 LAB — HCV INTERPRETATION

## 2023-01-14 LAB — HEMOGLOBIN A1C
Est. average glucose Bld gHb Est-mCnc: 111 mg/dL
Hgb A1c MFr Bld: 5.5 % (ref 4.8–5.6)

## 2023-01-15 LAB — MONITOR DRUG PROFILE 14(MW)
Amphetamine Scrn, Ur: NEGATIVE ng/mL
BARBITURATE SCREEN URINE: NEGATIVE ng/mL
BENZODIAZEPINE SCREEN, URINE: NEGATIVE ng/mL
Buprenorphine, Urine: NEGATIVE ng/mL
CANNABINOIDS UR QL SCN: NEGATIVE ng/mL
Cocaine (Metab) Scrn, Ur: NEGATIVE ng/mL
Creatinine(Crt), U: 301.7 mg/dL — ABNORMAL HIGH (ref 20.0–300.0)
Fentanyl, Urine: NEGATIVE pg/mL
Meperidine Screen, Urine: NEGATIVE ng/mL
Methadone Screen, Urine: NEGATIVE ng/mL
OXYCODONE+OXYMORPHONE UR QL SCN: NEGATIVE ng/mL
Opiate Scrn, Ur: NEGATIVE ng/mL
Ph of Urine: 5.8 (ref 4.5–8.9)
Phencyclidine Qn, Ur: NEGATIVE ng/mL
Propoxyphene Scrn, Ur: NEGATIVE ng/mL
SPECIFIC GRAVITY: 1.028
Tramadol Screen, Urine: NEGATIVE ng/mL

## 2023-01-15 LAB — URINE CULTURE, OB REFLEX: Organism ID, Bacteria: NO GROWTH

## 2023-01-15 LAB — NICOTINE SCREEN, URINE: Cotinine Ql Scrn, Ur: NEGATIVE ng/mL

## 2023-01-15 LAB — CULTURE, OB URINE

## 2023-01-21 ENCOUNTER — Ambulatory Visit (INDEPENDENT_AMBULATORY_CARE_PROVIDER_SITE_OTHER): Payer: Medicaid Other | Admitting: Obstetrics & Gynecology

## 2023-01-21 ENCOUNTER — Encounter: Payer: Self-pay | Admitting: Obstetrics & Gynecology

## 2023-01-21 ENCOUNTER — Encounter: Payer: Self-pay | Admitting: Obstetrics

## 2023-01-21 VITALS — BP 103/66 | HR 88 | Wt 221.0 lb

## 2023-01-21 DIAGNOSIS — Z3480 Encounter for supervision of other normal pregnancy, unspecified trimester: Secondary | ICD-10-CM

## 2023-01-21 LAB — URINE CYTOLOGY ANCILLARY ONLY
Chlamydia: NEGATIVE
Comment: NEGATIVE
Comment: NORMAL
Neisseria Gonorrhea: NEGATIVE

## 2023-01-21 NOTE — Progress Notes (Signed)
   Established Patient Office Visit  Subjective   Patient ID: Kristin Cisneros, female    DOB: [birth year]  Age: 22 y.o. MRN: [id number]  No chief complaint on file.   HPI   22 yo G3P2 here for a work in visit because her toddler fell on the patient's abdomen. She denies bleeding.  Objective:      Physical Exam   Well nourished, well hydrated Black female, no apparent distress She is ambulating and conversing normally. FHR- 150s and reassuring Abd- benign  Assessment & Plan:   Problem List Items Addressed This Visit   None   Reassurance given regarding mild trauma to abdomens during pregnancy She will keep her scheduled appt/prn sooner   Allie Bossier, MD

## 2023-01-23 LAB — MATERNIT 21 PLUS CORE, BLOOD
Fetal Fraction: 5
Result (T21): NEGATIVE
Trisomy 13 (Patau syndrome): NEGATIVE
Trisomy 18 (Edwards syndrome): NEGATIVE
Trisomy 21 (Down syndrome): NEGATIVE

## 2023-01-26 ENCOUNTER — Telehealth: Payer: Self-pay | Admitting: Obstetrics and Gynecology

## 2023-01-26 NOTE — Telephone Encounter (Signed)
Please put baby's gender is a sealed envelope for pick up by Gardiner Ramus Grant.(Will have her ID with her)

## 2023-01-26 NOTE — Telephone Encounter (Signed)
Results printed and placed up front for pick-up.

## 2023-02-10 ENCOUNTER — Ambulatory Visit (INDEPENDENT_AMBULATORY_CARE_PROVIDER_SITE_OTHER): Payer: Medicaid Other

## 2023-02-10 VITALS — BP 106/57 | HR 97 | Wt 224.0 lb

## 2023-02-10 DIAGNOSIS — Z3A16 16 weeks gestation of pregnancy: Secondary | ICD-10-CM

## 2023-02-10 DIAGNOSIS — Z3689 Encounter for other specified antenatal screening: Secondary | ICD-10-CM

## 2023-02-10 DIAGNOSIS — Z8759 Personal history of other complications of pregnancy, childbirth and the puerperium: Secondary | ICD-10-CM | POA: Insufficient documentation

## 2023-02-10 DIAGNOSIS — D696 Thrombocytopenia, unspecified: Secondary | ICD-10-CM | POA: Insufficient documentation

## 2023-02-10 DIAGNOSIS — Z3480 Encounter for supervision of other normal pregnancy, unspecified trimester: Secondary | ICD-10-CM

## 2023-02-10 NOTE — Assessment & Plan Note (Addendum)
-   Desires to deliver at [facility] because her two previous deliveries were there. Her previous prenatal care was with [facility], and she plans to transfer there. Encouraged her to call to make an appointment, referral also sent. - She will make an appointment for her anatomy ultrasound and next ROB with [place] in case she cannot be seen in the next month at [facility]. - Declines flu vaccine and AFP testing.  - Reviewed red flag warning signs anticipatory guidance for upcoming prenatal care.

## 2023-02-10 NOTE — Progress Notes (Signed)
    Return Prenatal Note   Assessment/Plan   Plan  22 y.o. G3P2002 at [redacted]w[redacted]d presents for follow-up OB visit. Reviewed prenatal record including previous visit note.  Supervision of other normal pregnancy, antepartum - Desires to deliver at [facility] because her two previous deliveries were there. Her previous prenatal care was with [facility], and she plans to transfer there. Encouraged her to call to make an appointment, referral also sent. - She will make an appointment for her anatomy ultrasound and next ROB with [place] in case she cannot be seen in the next month at [facility]. - Declines flu vaccine and AFP testing.  - Reviewed red flag warning signs anticipatory guidance for upcoming prenatal care.    Orders Placed This Encounter  Procedures   US OB Comp + 14 Wk    Standing Status:   Future    Standing Expiration Date:   05/06/2023    Order Specific Question:   Reason for Exam (SYMPTOM  OR DIAGNOSIS REQUIRED)    Answer:   Fetal Anatomic Survey    Order Specific Question:   Preferred Imaging Location?    Answer:   Internal   Ambulatory referral to Obstetrics / Gynecology    Referral Priority:   Routine    Referral Type:   Consultation    Referral Reason:   Specialty Services Required    Requested Specialty:   Obstetrics and Gynecology    Number of Visits Requested:   1   Return in about 4 weeks (around 03/10/2023) for ROB.   Future Appointments  Date Time Provider Department Center  03/10/2023  8:55 AM Chryl Heck, Adan Sis, CNM [facility] None    For next visit:  continue with routine prenatal care     Subjective   22 y.o. [id number] at [redacted]w[redacted]d presents for this follow-up prenatal visit.  Patient has no concerns. Desires to transfer care to [facility]. Patient reports: Movement: Absent Contractions: Not present  Objective   Flow sheet Vitals: Pulse Rate: 97 BP: (!) 106/57 Fundal Height: 16 cm Fetal Heart Rate (bpm): 140 Total weight gain: 4 lb (1.814  kg)  General Appearance  No acute distress, well appearing, and well nourished Pulmonary   Normal work of breathing Neurologic   Alert and oriented to person, place, and time Psychiatric   Mood and affect within normal limits  Lindalou Hose Janna Oak, CNM  09/25/242:48 PM

## 2023-03-10 ENCOUNTER — Encounter: Payer: Medicaid Other | Admitting: Obstetrics

## 2023-03-10 ENCOUNTER — Encounter: Payer: Self-pay | Admitting: Obstetrics

## 2023-03-10 ENCOUNTER — Ambulatory Visit (INDEPENDENT_AMBULATORY_CARE_PROVIDER_SITE_OTHER): Payer: Medicaid Other | Admitting: Obstetrics

## 2023-03-10 VITALS — BP 112/60 | HR 109 | Wt 223.9 lb

## 2023-03-10 DIAGNOSIS — Z3480 Encounter for supervision of other normal pregnancy, unspecified trimester: Secondary | ICD-10-CM

## 2023-03-10 NOTE — Progress Notes (Signed)
    Return Prenatal Note   Assessment/Plan   Plan  22 y.o. G3P2002 at [redacted]w[redacted]d presents for follow-up OB visit. Reviewed prenatal record including previous visit note.  Supervision of other normal pregnancy, antepartum -NOB transfer appt with Kristin Cisneros on 03/23/23 -Will try to schedule anatomy scan ASAP -Anticipatory guidance about the second trimester   No orders of the defined types were placed in this encounter.  No follow-ups on file.   Future Appointments  Date Time Provider Department Center  04/07/2023  9:35 AM Free, Kristin Cisneros, CNM [facility] None    For next visit:  continue with routine prenatal care     Subjective  Kristin Cisneros is feeling a lot of baby movement. She is planning to transfer to [facility] and has her first appointment set up. She has not made it to her anatomy scan but will try to get it scheduled ASAP.  Movement: Present  Objective   Flow sheet Vitals: Pulse Rate: (!) 109 BP: 112/60 Fundal Height: 22 cm Fetal Heart Rate (bpm): 156 Total weight gain: 3 lb 14.4 oz (1.769 kg)  General Appearance  No acute distress, well appearing, and well nourished Pulmonary   Normal work of breathing Neurologic   Alert and oriented to person, place, and time Psychiatric   Mood and affect within normal limits  Kristin Cisneros, CNM 03/10/23 3:35 PM

## 2023-03-10 NOTE — Assessment & Plan Note (Signed)
-  NOB transfer appt with Bernestine Amass on 03/23/23 -Will try to schedule anatomy scan ASAP -Anticipatory guidance about the second trimester

## 2023-04-07 ENCOUNTER — Telehealth: Payer: Self-pay

## 2023-04-07 DIAGNOSIS — Z3480 Encounter for supervision of other normal pregnancy, unspecified trimester: Secondary | ICD-10-CM

## 2023-04-07 DIAGNOSIS — Z6839 Body mass index (BMI) 39.0-39.9, adult: Secondary | ICD-10-CM

## 2023-04-07 DIAGNOSIS — Z3A24 24 weeks gestation of pregnancy: Secondary | ICD-10-CM

## 2023-04-07 NOTE — Telephone Encounter (Signed)
Reached out to pt to reschedule ROB appt that was scheduled on 04/07/2023 at 9:35 with S. Free.  Left message for pt to call back.

## 2023-04-08 NOTE — Telephone Encounter (Signed)
Reached out to pt (2x) to reschedule ROB appt that was scheduled on 04/07/2023 at 9:35 with S. Free.  Pt states that she has transferred her care.  No longer coming to this OB/GYN.

## 2023-07-11 ENCOUNTER — Other Ambulatory Visit: Payer: Self-pay

## 2023-07-11 ENCOUNTER — Emergency Department (HOSPITAL_COMMUNITY)
Admission: EM | Admit: 2023-07-11 | Discharge: 2023-07-11 | Disposition: A | Discharge status: Home / Self Care | Payer: Medicaid Other | Attending: Emergency Medicine | Admitting: Emergency Medicine

## 2023-07-11 ENCOUNTER — Encounter (HOSPITAL_COMMUNITY): Payer: Self-pay

## 2023-07-11 DIAGNOSIS — Z3A39 39 weeks gestation of pregnancy: Secondary | ICD-10-CM | POA: Insufficient documentation

## 2023-07-11 DIAGNOSIS — O26893 Other specified pregnancy related conditions, third trimester: Secondary | ICD-10-CM | POA: Insufficient documentation

## 2023-07-11 DIAGNOSIS — Z7982 Long term (current) use of aspirin: Secondary | ICD-10-CM | POA: Diagnosis not present

## 2023-07-11 DIAGNOSIS — R1030 Lower abdominal pain, unspecified: Secondary | ICD-10-CM | POA: Diagnosis not present

## 2023-07-11 DIAGNOSIS — Z331 Pregnant state, incidental: Secondary | ICD-10-CM

## 2023-07-11 LAB — URINALYSIS, ROUTINE W REFLEX MICROSCOPIC
Bacteria, UA: NONE SEEN
Bilirubin Urine: NEGATIVE
Glucose, UA: NEGATIVE mg/dL
Hgb urine dipstick: NEGATIVE
Ketones, ur: NEGATIVE mg/dL
Nitrite: NEGATIVE
Protein, ur: NEGATIVE mg/dL
Specific Gravity, Urine: 1.012 (ref 1.005–1.030)
pH: 7 (ref 5.0–8.0)

## 2023-07-11 NOTE — Discharge Instructions (Signed)
 Take Tylenol for pain and follow-up with your OB/GYN as scheduled

## 2023-07-11 NOTE — ED Provider Notes (Signed)
 [place] EMERGENCY DEPARTMENT AT [facility] Provider Note   CSN: [id number] Arrival date & time: 07/11/23  1904     History  No chief complaint on file.   Kristin Cisneros is a 23 y.o. female.  Patient is about [redacted] weeks pregnant.  This is her third pregnancy and she has had 2 children.  She complains of pain down near her pubis.  She has been having this pain for a long time and her OB/GYN states that it is from the pressure of her baby.  The history is provided by the patient and medical records. No language interpreter was used.  Abdominal Pain Pain location:  Suprapubic Pain quality: aching   Pain radiates to:  Does not radiate Pain severity:  Mild Onset quality:  Sudden Timing:  Constant Progression:  Worsening Chronicity:  Recurrent Context: not alcohol use   Relieved by:  Nothing Worsened by:  Nothing Ineffective treatments:  None tried Associated symptoms: no anorexia, no chest pain, no cough, no diarrhea, no fatigue and no hematuria        Home Medications Prior to Admission medications   Medication Sig Start Date End Date Taking? Authorizing Provider  Ferrous Sulfate (IRON) 325 (65 Fe) MG TABS Take 1 tablet by mouth daily. 06/15/23  Yes [provider]  valACYclovir (VALTREX) 500 MG tablet Take 500 mg by mouth 2 (two) times daily. 06/24/23  Yes [provider]  albuterol (VENTOLIN HFA) 108 (90 Base) MCG/ACT inhaler Inhale 1-2 puffs into the lungs every 6 (six) hours as needed for wheezing or shortness of breath. 01/13/23   Glenetta Borg, CNM  aspirin EC 81 MG tablet Take 2 tablets (162 mg total) by mouth daily. Swallow whole. 01/13/23   Glenetta Borg, CNM  ondansetron (ZOFRAN) 4 MG tablet Take 1 tablet (4 mg total) by mouth every 8 (eight) hours as needed for nausea or vomiting. 01/13/23   Glenetta Borg, CNM  Prenatal Vit-Fe Fumarate-FA (PRENATAL MULTIVITAMIN) TABS tablet Take 1 tablet by mouth daily at 12 noon.    [provider]  pyridOXINE (VITAMIN B6) 25 MG tablet Take 25 mg by mouth 2 (two) times daily. 06/24/23   [provider]  SYMBICORT 160-4.5 MCG/ACT inhaler Inhale 2 puffs into the lungs. 04/06/23   [provider]  cetirizine (ZYRTEC) 10 MG tablet Take 1 tablet (10 mg total) by mouth daily. 09/30/19 01/24/20  Rennis Harding, PA-C      Allergies    Patient has no known allergies.    Review of Systems   Review of Systems  Constitutional:  Negative for appetite change and fatigue.  HENT:  Negative for congestion, ear discharge and sinus pressure.   Eyes:  Negative for discharge.  Respiratory:  Negative for cough.   Cardiovascular:  Negative for chest pain.  Gastrointestinal:  Positive for abdominal pain. Negative for anorexia and diarrhea.  Genitourinary:  Negative for frequency and hematuria.  Musculoskeletal:  Negative for back pain.  Skin:  Negative for rash.  Neurological:  Negative for seizures and headaches.  Psychiatric/Behavioral:  Negative for hallucinations.     Physical Exam Updated Vital Signs BP 125/76   Pulse 83   Temp 99.2 F (37.3 C) (Oral)   Resp 17   Ht 5\' 2"  (1.575 m)   Wt 108.9 kg   LMP 10/15/2022 (Approximate)   SpO2 98%   BMI 43.90 kg/m  Physical Exam Vitals and nursing note reviewed.  Constitutional:  Appearance: She is well-developed.  HENT:     Head: Normocephalic.     Nose: Nose normal.  Eyes:     General: No scleral icterus.    Conjunctiva/sclera: Conjunctivae normal.  Neck:     Thyroid: No thyromegaly.  Cardiovascular:     Rate and Rhythm: Normal rate and regular rhythm.     Heart sounds: No murmur heard.    No friction rub. No gallop.  Pulmonary:     Breath sounds: No stridor. No wheezing or rales.  Chest:     Chest wall: No tenderness.  Abdominal:     General: There is no distension.     Tenderness: There is abdominal tenderness. There is no rebound.  Genitourinary:    Comments: Patient vertex presentation.  0%  effaced 1 cm dilated Musculoskeletal:        General: Normal range of motion.     Cervical back: Neck supple.  Lymphadenopathy:     Cervical: No cervical adenopathy.  Skin:    Findings: No erythema or rash.  Neurological:     Mental Status: She is alert and oriented to person, place, and time.     Motor: No abnormal muscle tone.     Coordination: Coordination normal.  Psychiatric:        Behavior: Behavior normal.     ED Results / Procedures / Treatments   Labs (all labs ordered are listed, but only abnormal results are displayed) Labs Reviewed  URINALYSIS, ROUTINE W REFLEX MICROSCOPIC - Abnormal; Notable for the following components:      Result Value   Leukocytes,Ua SMALL (*)    All other components within normal limits    EKG None  Radiology No results found.  Procedures Procedures    Medications Ordered in ED Medications - No data to display  ED Course/ Medical Decision Making/ A&P                                 Medical Decision Making Amount and/or Complexity of Data Reviewed Labs: ordered.  Patient is not in labor Patient with pelvic pain secondary to compression from the baby.  She will take Tylenol follow-up with her OB/GYN        Final Clinical Impression(s) / ED Diagnoses Final diagnoses:  IUP (intrauterine pregnancy), incidental    Rx / DC Orders ED Discharge Orders     None         Bethann Berkshire, MD 07/18/23 1708

## 2023-07-11 NOTE — Progress Notes (Signed)
 1939: OBRRN called for pt 38.3 wks G3P2 c/o pelvic pressure. No LOF, no bleeding, no ctx, endorses fetal movement. Pt placed on monitor. First BP 145/90, no hx of hypertension nurse will cycle q71min.   2025: Dr. Vergie Living on unit notified of pt 38.3 wks G3P2 c/o pelvic pressure. No LOF, no bleeding, no ctx, endorses fetal movement. Pt placed on monitor. First BP 145/90, no hx of hypertension repeat bp's all 120s/70s. 1-2 ctx on monitor. ED doc performed SVE didn't feel as though pt was dilated at all. Pt OB cleared at this time.   2036: [facility] called, pt OB cleared at this time.

## 2023-07-11 NOTE — ED Triage Notes (Signed)
 Pt presents to ED pt is almost [redacted] weeks pregnant, denies contractions, but says she is having pelvic pain and back pain. Pt is visiting from [place] and supposed to be delivering baby at [facility], she states, "I just want my cervix checked." Pt states last cervix check was valentines day and she was 1 cm dilated.
